# Patient Record
Sex: Female | Born: 1960 | Race: White | Hispanic: No | Smoking: Former smoker
Health system: Southern US, Community
[De-identification: ages and names within clinical notes are randomized; demographics above are authoritative.]

## PROBLEM LIST (undated history)

## (undated) DIAGNOSIS — B009 Herpesviral infection, unspecified: Secondary | ICD-10-CM

## (undated) DIAGNOSIS — B9681 Helicobacter pylori [H. pylori] as the cause of diseases classified elsewhere: Secondary | ICD-10-CM

## (undated) DIAGNOSIS — K7689 Other specified diseases of liver: Secondary | ICD-10-CM

## (undated) DIAGNOSIS — K649 Unspecified hemorrhoids: Secondary | ICD-10-CM

## (undated) DIAGNOSIS — F419 Anxiety disorder, unspecified: Secondary | ICD-10-CM

## (undated) DIAGNOSIS — I319 Disease of pericardium, unspecified: Secondary | ICD-10-CM

## (undated) DIAGNOSIS — K589 Irritable bowel syndrome without diarrhea: Secondary | ICD-10-CM

## (undated) DIAGNOSIS — F319 Bipolar disorder, unspecified: Secondary | ICD-10-CM

## (undated) DIAGNOSIS — F329 Major depressive disorder, single episode, unspecified: Secondary | ICD-10-CM

## (undated) DIAGNOSIS — K5190 Ulcerative colitis, unspecified, without complications: Secondary | ICD-10-CM

## (undated) DIAGNOSIS — F32A Depression, unspecified: Secondary | ICD-10-CM

## (undated) DIAGNOSIS — K5790 Diverticulosis of intestine, part unspecified, without perforation or abscess without bleeding: Secondary | ICD-10-CM

## (undated) DIAGNOSIS — K219 Gastro-esophageal reflux disease without esophagitis: Secondary | ICD-10-CM

## (undated) DIAGNOSIS — K2970 Gastritis, unspecified, without bleeding: Secondary | ICD-10-CM

## (undated) DIAGNOSIS — Z9889 Other specified postprocedural states: Secondary | ICD-10-CM

## (undated) DIAGNOSIS — Z8719 Personal history of other diseases of the digestive system: Secondary | ICD-10-CM

## (undated) HISTORY — PX: ABDOMINAL HYSTERECTOMY: SHX81

## (undated) HISTORY — DX: Herpesviral infection, unspecified: B00.9

## (undated) HISTORY — DX: Unspecified hemorrhoids: K64.9

## (undated) HISTORY — DX: Depression, unspecified: F32.A

## (undated) HISTORY — PX: HERNIA REPAIR: SHX51

## (undated) HISTORY — DX: Gastritis, unspecified, without bleeding: K29.70

## (undated) HISTORY — DX: Personal history of other diseases of the digestive system: Z87.19

## (undated) HISTORY — DX: Ulcerative colitis, unspecified, without complications: K51.90

## (undated) HISTORY — DX: Gastro-esophageal reflux disease without esophagitis: K21.9

## (undated) HISTORY — DX: Major depressive disorder, single episode, unspecified: F32.9

## (undated) HISTORY — DX: Anxiety disorder, unspecified: F41.9

## (undated) HISTORY — PX: ESOPHAGOGASTRODUODENOSCOPY: SHX1529

## (undated) HISTORY — DX: Irritable bowel syndrome, unspecified: K58.9

## (undated) HISTORY — DX: Disease of pericardium, unspecified: I31.9

## (undated) HISTORY — DX: Helicobacter pylori (H. pylori) as the cause of diseases classified elsewhere: B96.81

## (undated) HISTORY — DX: Other specified diseases of liver: K76.89

## (undated) HISTORY — DX: Bipolar disorder, unspecified: F31.9

## (undated) HISTORY — PX: TUBAL LIGATION: SHX77

## (undated) HISTORY — DX: Diverticulosis of intestine, part unspecified, without perforation or abscess without bleeding: K57.90

## (undated) HISTORY — PX: OTHER SURGICAL HISTORY: SHX169

## (undated) HISTORY — DX: Other specified postprocedural states: Z98.890

## (undated) HISTORY — PX: COLONOSCOPY: SHX174

## (undated) HISTORY — PX: DILATION AND CURETTAGE OF UTERUS: SHX78

---

## 1999-03-02 ENCOUNTER — Encounter: Payer: Self-pay | Admitting: Family Medicine

## 1999-03-02 ENCOUNTER — Ambulatory Visit (HOSPITAL_COMMUNITY): Admission: RE | Admit: 1999-03-02 | Discharge: 1999-03-02 | Payer: Self-pay | Admitting: Family Medicine

## 2001-04-09 ENCOUNTER — Other Ambulatory Visit: Admission: RE | Admit: 2001-04-09 | Discharge: 2001-04-09 | Payer: Self-pay | Admitting: Obstetrics and Gynecology

## 2002-04-10 ENCOUNTER — Observation Stay (HOSPITAL_COMMUNITY): Admission: EM | Admit: 2002-04-10 | Discharge: 2002-04-11 | Payer: Self-pay | Admitting: *Deleted

## 2002-04-10 ENCOUNTER — Encounter: Payer: Self-pay | Admitting: *Deleted

## 2003-08-14 ENCOUNTER — Ambulatory Visit (HOSPITAL_COMMUNITY): Admission: RE | Admit: 2003-08-14 | Discharge: 2003-08-14 | Payer: Self-pay | Admitting: Family Medicine

## 2004-01-30 ENCOUNTER — Ambulatory Visit (HOSPITAL_COMMUNITY): Admission: RE | Admit: 2004-01-30 | Discharge: 2004-01-30 | Payer: Self-pay | Admitting: Cardiology

## 2005-03-14 ENCOUNTER — Ambulatory Visit: Payer: Self-pay | Admitting: Internal Medicine

## 2005-03-17 ENCOUNTER — Ambulatory Visit: Payer: Self-pay | Admitting: Internal Medicine

## 2005-03-17 ENCOUNTER — Encounter (INDEPENDENT_AMBULATORY_CARE_PROVIDER_SITE_OTHER): Payer: Self-pay | Admitting: *Deleted

## 2005-04-03 ENCOUNTER — Emergency Department (HOSPITAL_COMMUNITY): Admission: AD | Admit: 2005-04-03 | Discharge: 2005-04-03 | Payer: Self-pay | Admitting: Family Medicine

## 2005-05-10 ENCOUNTER — Ambulatory Visit: Payer: Self-pay | Admitting: Internal Medicine

## 2005-07-12 ENCOUNTER — Ambulatory Visit: Payer: Self-pay | Admitting: Internal Medicine

## 2006-08-29 ENCOUNTER — Ambulatory Visit (HOSPITAL_COMMUNITY): Admission: RE | Admit: 2006-08-29 | Discharge: 2006-08-29 | Payer: Self-pay | Admitting: Family Medicine

## 2006-09-26 DIAGNOSIS — Z8719 Personal history of other diseases of the digestive system: Secondary | ICD-10-CM

## 2006-09-26 HISTORY — DX: Personal history of other diseases of the digestive system: Z87.19

## 2006-12-26 DIAGNOSIS — B9681 Helicobacter pylori [H. pylori] as the cause of diseases classified elsewhere: Secondary | ICD-10-CM

## 2006-12-26 HISTORY — DX: Helicobacter pylori (H. pylori) as the cause of diseases classified elsewhere: B96.81

## 2007-01-17 ENCOUNTER — Ambulatory Visit: Payer: Self-pay | Admitting: Internal Medicine

## 2007-01-17 LAB — CONVERTED CEMR LAB
ALT: 14 units/L (ref 0–40)
AST: 20 units/L (ref 0–37)
Albumin: 3.4 g/dL — ABNORMAL LOW (ref 3.5–5.2)
Alkaline Phosphatase: 55 units/L (ref 39–117)
BUN: 12 mg/dL (ref 6–23)
Basophils Absolute: 0 10*3/uL (ref 0.0–0.1)
Chloride: 107 meq/L (ref 96–112)
Creatinine, Ser: 0.8 mg/dL (ref 0.4–1.2)
HCT: 42.4 % (ref 36.0–46.0)
MCHC: 34.9 g/dL (ref 30.0–36.0)
Monocytes Relative: 11.2 % — ABNORMAL HIGH (ref 3.0–11.0)
RBC: 4.53 M/uL (ref 3.87–5.11)
RDW: 11.2 % — ABNORMAL LOW (ref 11.5–14.6)
Total Bilirubin: 0.6 mg/dL (ref 0.3–1.2)

## 2007-01-22 ENCOUNTER — Ambulatory Visit: Payer: Self-pay | Admitting: Internal Medicine

## 2007-01-22 ENCOUNTER — Encounter: Payer: Self-pay | Admitting: Internal Medicine

## 2007-11-07 ENCOUNTER — Ambulatory Visit: Payer: Self-pay | Admitting: Cardiovascular Disease

## 2008-05-19 ENCOUNTER — Ambulatory Visit: Payer: Self-pay | Admitting: Internal Medicine

## 2008-05-19 DIAGNOSIS — K5150 Left sided colitis without complications: Secondary | ICD-10-CM | POA: Insufficient documentation

## 2008-05-19 DIAGNOSIS — K222 Esophageal obstruction: Secondary | ICD-10-CM | POA: Insufficient documentation

## 2008-05-19 DIAGNOSIS — K5732 Diverticulitis of large intestine without perforation or abscess without bleeding: Secondary | ICD-10-CM | POA: Insufficient documentation

## 2009-01-09 ENCOUNTER — Encounter: Admission: RE | Admit: 2009-01-09 | Discharge: 2009-01-09 | Payer: Self-pay | Admitting: Family Medicine

## 2009-07-29 ENCOUNTER — Telehealth: Payer: Self-pay | Admitting: Internal Medicine

## 2009-09-03 ENCOUNTER — Ambulatory Visit: Payer: Self-pay | Admitting: Internal Medicine

## 2009-12-29 ENCOUNTER — Ambulatory Visit: Payer: Self-pay | Admitting: Cardiology

## 2009-12-29 DIAGNOSIS — R42 Dizziness and giddiness: Secondary | ICD-10-CM | POA: Insufficient documentation

## 2009-12-29 DIAGNOSIS — R0789 Other chest pain: Secondary | ICD-10-CM | POA: Insufficient documentation

## 2009-12-29 DIAGNOSIS — R0602 Shortness of breath: Secondary | ICD-10-CM | POA: Insufficient documentation

## 2009-12-29 DIAGNOSIS — R079 Chest pain, unspecified: Secondary | ICD-10-CM | POA: Insufficient documentation

## 2010-10-26 NOTE — Assessment & Plan Note (Signed)
Summary: palpitations, sob, chest fullness/cy.   Visit Type:  new pt visit Primary Provider:  Herb Grays  CC:  palpitations...sob when she had palpatations...pain on left arm/shoulder..chest fullness feeling....  History of Present Illness: Valerie Reed returns today for reevaluation of recurrent chest pain.  I saw her originally in 2005 which turned out to be pericarditis. Responded to nonsteroidals as well as colcichine.  2 nights ago, when she laid down around 11:00, she developed some irregularity and palpitations. She then had a dull ache in the mid sternum. It did not radiate. She had no nausea vomiting diaphoresis.  She was able to go to sleep but was bothered by low bit yesterday. It is not exertion related.  She denies any fever chills sweats hemoptysis or pleuritic chest pain. She has had no lower shiny edema and no signs of clots or swelling.  She exercises on a regular basis and has not noted any decrease in her functional capacity.  She is already feeling better later today.  Preventive Screening-Counseling & Management  Alcohol-Tobacco     Smoking Status: never  Caffeine-Diet-Exercise     Does Patient Exercise: yes      Drug Use:  no.    Current Medications (verified): 1)  Premarin 0.3 Mg Tabs (Estrogens Conjugated) .Marland Kitchen.. 1 Tab Once Daily 2)  Depakote Er 500 Mg Xr24h-Tab (Divalproex Sodium) .Marland Kitchen.. 1 Tab Once Daily 3)  Lamictal 100 Mg Tabs (Lamotrigine) .Marland Kitchen.. 1 Tab Once Daily 4)  Asacol 400 Mg Tbec (Mesalamine) .... 3 Tabs Two Times A Day  Allergies (verified): No Known Drug Allergies  Past History:  Past Medical History: Ulcerative colitis  Hx of Pericarditis Hx Anxiety and Depression  Past Surgical History: Hysterectomy  Social History: Full Time Divorced  Tobacco Use - No.  Alcohol Use - yes Regular Exercise - yes Drug Use - no Smoking Status:  never Does Patient Exercise:  yes Drug Use:  no  Review of Systems       negative other than history  of present illness  Vital Signs:  Patient profile:   50 year old female Height:      66 inches Weight:      139 pounds BMI:     22.52 Pulse rate:   68 / minute Pulse rhythm:   regular BP sitting:   92 / 64  (left arm) Cuff size:   large  Vitals Entered By: Danielle Rankin, CMA (December 29, 2009 4:47 PM)  Physical Exam  General:  Well developed, well nourished, in no acute distress. Head:  normocephalic and atraumatic Eyes:  PERRLA/EOM intact; conjunctiva and lids normal. Neck:  Neck supple, no JVD. No masses, thyromegaly or abnormal cervical nodes. Chest Valerie Reed:  no deformities or breast masses noted Lungs:  Clear bilaterally to auscultation and percussion. Heart:  Non-displaced PMI, chest non-tender; regular rate and rhythm, S1, S2 without murmurs, rubs or gallops. Carotid upstroke normal, no bruit. Normal abdominal aortic size, no bruits. Femorals normal pulses, no bruits. Pedals normal pulses. No edema, no varicosities. Abdomen:  Bowel sounds positive; abdomen soft and non-tender without masses, organomegaly, or hernias noted. No hepatosplenomegaly. Msk:  Back normal, normal gait. Muscle strength and tone normal. Pulses:  pulses normal in all 4 extremities Extremities:  No clubbing or cyanosis. Neurologic:  Alert and oriented x 3. Skin:  Intact without lesions or rashes. Psych:  Normal affect.   Problems:  Medical Problems Added: 1)  Dx of Chest Pain-unspecified  (ICD-786.50)  EKG  Procedure date:  12/29/2009  Findings:      normal sinus rhythm, normal EKG  Impression & Recommendations:  Problem # 1:  CHEST PAIN-UNSPECIFIED (ICD-786.50) Assessment New This is clearly not her cardiac pain. Is not supported by history, exam, or EKG. Reassurance is been given. No further cardiac workup at this time. Orders: EKG w/ Interpretation (93000)  Patient Instructions: 1)  Your physician recommends that you schedule a follow-up appointment in: AS NEEDED 2)  Your physician  recommends that you continue on your current medications as directed. Please refer to the Current Medication list given to you today.

## 2011-02-03 ENCOUNTER — Other Ambulatory Visit: Payer: Self-pay | Admitting: Family Medicine

## 2011-02-03 DIAGNOSIS — N63 Unspecified lump in unspecified breast: Secondary | ICD-10-CM

## 2011-02-08 NOTE — Assessment & Plan Note (Signed)
[facility]                            CARDIOLOGY OFFICE NOTE   NAME:HOVEY, GATHA MCNULTY                     MRN:          [id number]  DATE:11/07/2007                            DOB:          [birth year]    Mrs. Lamona Curl is a delightful 50 year old patient referred by Dr. Farris Has  and Dr. Collins Scotland for chest pain and dizziness and dyspnea.   The patient apparently has a history of pericarditis seen by Dr. Daleen Squibb in  the past.   She describes being on anti-inflammatories and colchicine.   The patient has not been feeling well for a few months.  She complains  of somewhat atypical chest pain.  It is not necessarily exertional.  It  is fairly sharp pain in left side of her chest that radiates up towards  her neck and shoulder.  It is worse on her left side.  She also  describes a pressure type sensation.  She is fairly active.  She does  yoga and exercises on a regular basis and this does not necessarily make  the pain worse.  Along with this, she describes dyspnea and fatigue.  These seem to be more chronic symptoms over the last few months.  Dr.  Lamona Curl checked her normal blood work including a sed rate and TSH and T4  and they were normal.   The patient describes dizziness.  It is nonspecific.  It is non  postural.  It is not related to neck movements or classic Vernier  maneuvers.   The the patient does have a history of anxiety and depression.   Her review of systems otherwise negative.  In particular the pain is not  particularly pleuritic.  There is been no history of DVT or PE.  She has  no history of a systemic connective tissue disease and has not had a  recurrence of her pericarditis recently.   PAST MEDICAL HISTORY:  Remarkable for ulcerative colitis.  Asacol  usually takes care of this.  There is question of mild asthma, anxiety,  depression, previous hysterectomy.   The patient is separated.  She and Dr. Lamona Curl seem to get along well.  She older  children.  She sells equipment to physicians.  She does not  smoke or drink.   FAMILY HISTORY:  Is remarkable for a brother who had an MI at age 24.  Mother is alive.  Father died at age 68 of cancer.   The patient's current medication include Depakote 500 as a call 400 mg 3  tablets b.i.d., Premarin 0.325 a day and omeprazole 20 a day.  She has  had some p.r.n., Naprosyn, but she does not like to take a lot of  antiinflammatories due to her ulcerative colitis.   EXAM:  Is remarkable for healthy-appearing white female who looks  younger than her stated age.  Affect is appropriate.  Weight is 137, blood pressure is 112/76, pulse 64 and regular,  respiratory rate 14.  HEENT:  Unremarkable.  Carotids are without bruit, no lymphadenopathy, thyromegaly, JVP  elevation.  LUNGS:  Clear diaphragmatic motion.  No wheezing.  S1-S2  without rub.  No murmur.  PMI normal.  Abdomen is benign.  Bowel sounds positive AAA No hepatosplenomegaly or  hepatojugular reflux, distal pulse intact, no edema.  NEURO:  Nonfocal.  SKIN:  Warm and dry.  No muscular weakness.  No real reproducibility of  pain to anterior posterior compression or abduction and adduction  maneuvers.   Her baseline EKG is normal.  As indicated Dr. Lamona Curl did some routine  blood work including a CBC BMET sed rate and TSH which were normal.  I  suggested to him that he possibly follow-up with a double-stranded DNA,  rheumatoid factor, ANA.  He did bring a disk in with him today with a  chest x-ray on the patient.  I reviewed this.  Heart size is normal.  Mediastinum is normal.  Lungs are clear with no abnormalities in her  chest film.   IMPRESSION:  1. Atypical chest pain, history of pericarditis.  Follow-up stress      echo.  2. Previous pericarditis without rubs sed rate normal.  Doubt      recurrence.  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  3. History of anxiety, depression.  Follow-up with Dr. Collins Scotland.  4. Ulcerative colitis continue Asacol.  Follow-up with Dr. Leone Payor,      one of her GI doctor she has seen in the past.  Continue omeprazole      20 a day.  5. Status post hysterectomy.  Continue Premarin 0.32 mg a day at      discretion of OB/GYN doctor.   So long as her stress echo was normal.  I do not think she will need  further cardiac workup     Peter C. Eden Emms, MD, [facility]  Electronically Signed    PCN/MedQ  DD: 11/07/2007  DT: 11/08/2007  Job #: [id number]   cc:   Tammy R. Collins Scotland, M.D.

## 2011-02-09 ENCOUNTER — Ambulatory Visit
Admission: RE | Admit: 2011-02-09 | Discharge: 2011-02-09 | Disposition: A | Discharge status: Home / Self Care | Payer: 59 | Source: Ambulatory Visit | Attending: Family Medicine | Admitting: Family Medicine

## 2011-02-09 DIAGNOSIS — N63 Unspecified lump in unspecified breast: Secondary | ICD-10-CM

## 2011-02-11 NOTE — H&P (Signed)
[place]  Patient:    Valerie Reed, Valerie Reed Visit Number: [id number] MRN: [id number]          Service Type: OBV Location: [id number] Attending Physician:  Fenton Malling Dictated by:   Kelli Hope, M.D. Admit Date:  04/10/2002 Discharge Date: 04/11/2002   CC:         Consuello Bossier., M.D.   History and Physical  DATE OF BIRTH:  1961/04/04  CHIEF COMPLAINT:  Postoperative confusion.  HISTORY OF PRESENT ILLNESS:  This is the initial [facility] admission for this 50 year old woman with little past medical history, who underwent an abdominoplasty yesterday at [facility].  She was groggy postop but not unusually difficult to arouse and had no focal problems.  This morning, she was noted to be more confused, dysarthric and disoriented.  She did not improve with observation or with administration of naloxone.  She was brought to [facility] Emergency Room for further evaluation.  Since that time, she has gotten a little bit better but she remains confused and distractible.  Review of her records indicates that she received an intramuscular injection of Demerol 75 mg and Phenergan 25 mg at about 1300 yesterday and subsequently received eight tablets of Mepergan Fortis over about a 16-hour period.  The patient presently denies any headache.  She is aware that she is not making sense but is only somewhat aware of just how confused she is, but she denies any focal symptoms including headache, weakness or numbness in the limbs, dysarthria or dysphagia.  PAST MEDICAL HISTORY:  She has been treated for depression in the past.  She also has mild asthma for which she rarely uses inhalers.  She has had a hysterectomy in the past.  SOCIAL HISTORY:  She lives with her husband is normally independent in her activities of daily living.  She works for a [employer].  FAMILY HISTORY:  Noncontributory.  ALLERGIES:  No  known drug allergies.  MEDICATIONS:  Premarin, Depakote, albuterol and Atrovent inhalers.  REVIEW OF SYSTEMS:  As above.  She denies any headache, chest pain, shortness of breath, nausea, vomiting, diarrhea.  She was able to eat yesterday.  She has had no urinary symptoms.  She has had some neck pain in the past but nothing recently.  She has had no fever or rash and vital signs were stable at [facility] yesterday.  PHYSICAL EXAMINATION:  VITAL SIGNS:  Temperature 98.2, blood pressure 133/82, pulse 65, respirations 18, O2 saturation 98% on room air.  GENERAL:  She is alert and in no evident distress.  Her speech is fluent and not dysarthric.  HEENT:  Head:  Cranium is normocephalic and atraumatic.  Oropharynx is benign.  NECK:  Supple without carotid bruit.  CHEST:  Clear to auscultation.  HEART:  Regular rate and rhythm without murmurs.  ABDOMEN:  Soft and somewhat tender near drain site.  Bowel sounds are normoactive.  She has drains in place.  The wounds do not appear actively infected.  EXTREMITIES:  No edema.  Pulses 2+.  NEUROLOGIC:  Mental status:  She is oriented to time but not to place, identifying at [facility] and later [facility].  She is able to register and recall three memory objects.  She can repeat a phrase but has a little bit of trouble with confrontational naming, but she seems distractible and often will briefly say something about a subject unrelated to the conversation.  Cranial nerves:  Funduscopic exam is benign.  Pupils are equal and briskly reactive.  Extraocular movements are normal without nystagmus.  Visual fields are full to confrontation.  Face, tongue and palate move normally and symmetrically.  Motor:  Normal bulk and tone.  Normal strength in all tested extremity muscles.  Sensation is grossly intact. Reflexes are symmetric.  Toes are downgoing.  Finger-to-nose is performed adequately.  She ambulates with one-person  moderate assist and is a little bit unsteady with doing that.  LABORATORY AND ACCESSORY DATA:  CBC and BMET are unremarkable.  CT of the head is personally reviewed and is normal.  IMPRESSION:  Postoperative encephalopathy.  This is almost certainly due to medications.  PLAN:  We will observe overnight in the hospital.  If she remains confused in the morning, we will consider repeat CT of the head and also check an EEG. Dictated by:   Kelli Hope, M.D. Attending Physician:  Fenton Malling DD:  04/10/02 TD:  04/13/02 Job: [id number]

## 2013-03-21 ENCOUNTER — Encounter: Payer: Self-pay | Admitting: Internal Medicine

## 2013-05-14 ENCOUNTER — Encounter: Payer: Self-pay | Admitting: Internal Medicine

## 2013-06-13 ENCOUNTER — Ambulatory Visit: Payer: 59 | Admitting: Internal Medicine

## 2013-12-29 ENCOUNTER — Emergency Department (HOSPITAL_COMMUNITY): Payer: Self-pay

## 2013-12-29 ENCOUNTER — Encounter (HOSPITAL_COMMUNITY): Payer: Self-pay | Admitting: Emergency Medicine

## 2013-12-29 DIAGNOSIS — F411 Generalized anxiety disorder: Secondary | ICD-10-CM | POA: Insufficient documentation

## 2013-12-29 DIAGNOSIS — F319 Bipolar disorder, unspecified: Secondary | ICD-10-CM | POA: Insufficient documentation

## 2013-12-29 DIAGNOSIS — K5190 Ulcerative colitis, unspecified, without complications: Secondary | ICD-10-CM | POA: Insufficient documentation

## 2013-12-29 DIAGNOSIS — K5730 Diverticulosis of large intestine without perforation or abscess without bleeding: Secondary | ICD-10-CM | POA: Insufficient documentation

## 2013-12-29 DIAGNOSIS — K589 Irritable bowel syndrome without diarrhea: Secondary | ICD-10-CM | POA: Insufficient documentation

## 2013-12-29 DIAGNOSIS — R51 Headache: Principal | ICD-10-CM | POA: Insufficient documentation

## 2013-12-29 DIAGNOSIS — Z87891 Personal history of nicotine dependence: Secondary | ICD-10-CM | POA: Insufficient documentation

## 2013-12-29 DIAGNOSIS — H539 Unspecified visual disturbance: Secondary | ICD-10-CM | POA: Insufficient documentation

## 2013-12-29 NOTE — ED Notes (Signed)
Nurse First rounds ; Nurse explained delay / wait time and process to pt. Respirations unlabored / no distress.

## 2013-12-29 NOTE — ED Notes (Signed)
The pt has had a lt sided headache since 1445 today.  Initially she had vision problems that started prior to the headache or head pressure.  .  She feels like her vision is tunnell vision worse on the lt.   No facial droop no arm drift.  No speech difficulties

## 2013-12-30 ENCOUNTER — Encounter (HOSPITAL_COMMUNITY): Payer: Self-pay | Admitting: Internal Medicine

## 2013-12-30 ENCOUNTER — Observation Stay (HOSPITAL_COMMUNITY): Payer: Self-pay

## 2013-12-30 ENCOUNTER — Observation Stay (HOSPITAL_COMMUNITY)
Admission: EM | Admit: 2013-12-30 | Discharge: 2013-12-30 | Disposition: A | Discharge status: Home / Self Care | Payer: Self-pay | Attending: Internal Medicine | Admitting: Internal Medicine

## 2013-12-30 DIAGNOSIS — R51 Headache: Secondary | ICD-10-CM

## 2013-12-30 DIAGNOSIS — R519 Headache, unspecified: Secondary | ICD-10-CM | POA: Diagnosis present

## 2013-12-30 DIAGNOSIS — H5340 Unspecified visual field defects: Secondary | ICD-10-CM

## 2013-12-30 DIAGNOSIS — K5150 Left sided colitis without complications: Secondary | ICD-10-CM

## 2013-12-30 LAB — CBC WITH DIFFERENTIAL/PLATELET
Basophils Absolute: 0 10*3/uL (ref 0.0–0.1)
Basophils Relative: 0 % (ref 0–1)
Eosinophils Absolute: 0.2 10*3/uL (ref 0.0–0.7)
Eosinophils Relative: 4 % (ref 0–5)
HCT: 40.9 % (ref 36.0–46.0)
Hemoglobin: 14.4 g/dL (ref 12.0–15.0)
Lymphocytes Relative: 43 % (ref 12–46)
Lymphs Abs: 3 10*3/uL (ref 0.7–4.0)
MCH: 32.7 pg (ref 26.0–34.0)
MCHC: 35.2 g/dL (ref 30.0–36.0)
MCV: 92.7 fL (ref 78.0–100.0)
Monocytes Absolute: 0.7 10*3/uL (ref 0.1–1.0)
Monocytes Relative: 11 % (ref 3–12)
Neutro Abs: 2.9 10*3/uL (ref 1.7–7.7)
Neutrophils Relative %: 42 % — ABNORMAL LOW (ref 43–77)
Platelets: 163 10*3/uL (ref 150–400)
RBC: 4.41 MIL/uL (ref 3.87–5.11)
RDW: 12 % (ref 11.5–15.5)
WBC: 6.9 10*3/uL (ref 4.0–10.5)

## 2013-12-30 LAB — I-STAT CHEM 8, ED
BUN: 11 mg/dL (ref 6–23)
Calcium, Ion: 1.17 mmol/L (ref 1.12–1.23)
Chloride: 106 mEq/L (ref 96–112)
Creatinine, Ser: 0.8 mg/dL (ref 0.50–1.10)
Glucose, Bld: 91 mg/dL (ref 70–99)
HCT: 40 % (ref 36.0–46.0)
Hemoglobin: 13.6 g/dL (ref 12.0–15.0)
Potassium: 3.7 mEq/L (ref 3.7–5.3)
Sodium: 144 mEq/L (ref 137–147)
TCO2: 26 mmol/L (ref 0–100)

## 2013-12-30 MED ORDER — MESALAMINE 1000 MG RE SUPP
1000.0000 mg | Freq: Every day | RECTAL | Status: DC
  Filled 2013-12-30: qty 1

## 2013-12-30 MED ORDER — ENOXAPARIN SODIUM 40 MG/0.4ML ~~LOC~~ SOLN
40.0000 mg | Freq: Every day | SUBCUTANEOUS | Status: DC
  Filled 2013-12-30: qty 0.4

## 2013-12-30 MED ORDER — LORAZEPAM 2 MG/ML IJ SOLN
1.0000 mg | Freq: Once | INTRAMUSCULAR | Status: AC
  Administered 2013-12-30: 1 mg via INTRAVENOUS
  Filled 2013-12-30: qty 1

## 2013-12-30 MED ORDER — ACETAMINOPHEN 650 MG RE SUPP: 650.0000 mg | Freq: Four times a day (QID) | RECTAL | Status: DC | PRN

## 2013-12-30 MED ORDER — ONDANSETRON HCL 4 MG/2ML IJ SOLN: 4.0000 mg | Freq: Four times a day (QID) | INTRAMUSCULAR | Status: DC | PRN

## 2013-12-30 MED ORDER — ACETAMINOPHEN 325 MG PO TABS: 650.0000 mg | ORAL_TABLET | Freq: Four times a day (QID) | ORAL | Status: DC | PRN

## 2013-12-30 MED ORDER — ONDANSETRON HCL 4 MG PO TABS: 4.0000 mg | ORAL_TABLET | Freq: Four times a day (QID) | ORAL | Status: DC | PRN

## 2013-12-30 NOTE — Progress Notes (Signed)
This NP called by Dr. Waymon AmatoHongalgi, attending, at shift change. Pt has been symptom free today. Pt's MRI pending and if NEG, can go home tonight. Discharge paper work, f/up, etc has already been done by [employer] Warner[employer]ongalgi.  This NP checked MRI results at 1955 and is neg for findings explaining her visual disturbances. RN Huntley DecSara called and told pt may be discharged home and be sure to f/up as an outpt as ordered. Discharge order placed.  Jimmye NormanKaren Kirby-Graham, NP [facility]

## 2013-12-30 NOTE — H&P (Signed)
Patient's PCP: Delorse Lek, MD  Chief Complaint: Vision problems on the left  History of Present Illness: Valerie Reed is a 53 y.o. Caucasian female with history of diverticulosis, ulcerative colitis, gastritis, irritable bowel syndrome, depression, and anxiety who presents with the above complaints.  Patient reports that her symptoms started at 245 p.m. when she noted initially that she had strange vision with blurring on the left field from both eyes.  She would have the symptoms last for about 10 minutes before it improved.  She had recurrent episodes every 5-15 minutes.  She then noticed that her symptoms were localized only in the left eye affecting the left side of the vision.  As her symptoms are not improving she presented to the emergency department for further evaluation.  She had the symptoms to midnight, after which she had improvement in her symptoms.  She did complain of having a pressure-like sensation over her frontal head.  She denies any symptoms like this in the past, but did note that she had occasional strange sensation which lasts for a few seconds.  Of note, she did indicate that she had had cold-like symptoms one and a half weeks ago.  She denies any recent fevers, chills, nausea, vomiting, chest pain, shortness of breath, abdominal pain, or diarrhea.  Patient was evaluated by neurology in the emergency department, who recommended an MRI of the brain.  Hospitalist service was asked to admit the patient for further care and management.  Of note, patient reports that she took a sleep aid the previous night, she has taken this medication in the past without any of the above episodes.  Review of Systems: All systems reviewed with the patient and positive as per history of present illness, otherwise all other systems are negative.  Past Medical History  Diagnosis Date  . Diverticulosis   . UC (ulcerative colitis)     Left sided   . Gastritis   . S/P dilatation of esophageal  stricture 2008  . Bipolar disorder   . Anxiety disorder   . Depression   . Pericarditis   . Helicobacter pylori gastritis 12/2006  . IBS (irritable bowel syndrome)    Past Surgical History  Procedure Laterality Date  . Colonoscopy    . Esophagogastroduodenoscopy    . Tummy tuck    . Hernia repair    . Abdominal hysterectomy     Family History  Problem Relation Age of Onset  . Colon cancer Neg Hx   . Diabetes Mother   . Heart disease Mother   . Irritable bowel syndrome Mother    History   Social History  . Marital Status: Divorced    Spouse Name: N/A    Number of Children: N/A  . Years of Education: N/A   Occupational History  . Not on file.   Social History Main Topics  . Smoking status: Former Smoker    Quit date: 12/30/2005  . Smokeless tobacco: Not on file  . Alcohol Use: No  . Drug Use: No  . Sexual Activity: Not on file   Other Topics Concern  . Not on file   Social History Narrative  . No narrative on file   Allergies: Review of patient's allergies indicates no known allergies.  Home Meds: Prior to Admission medications   Medication Sig Start Date End Date Taking? Authorizing Provider  mesalamine (CANASA) 1000 MG suppository Place 1,000 mg rectally at bedtime.   Yes Historical Provider, MD    Physical Exam: Blood pressure  118/59, pulse 69, temperature 98.3 F (36.8 C), temperature source Oral, resp. rate 18, height 5\' 6"  (1.676 m), weight 59.875 kg (132 lb), SpO2 99.00%. General: Awake, Oriented x3, No acute distress. HEENT: EOMI, Moist mucous membranes Neck: Supple CV: S1 and S2 Lungs: Clear to ascultation bilaterally Abdomen: Soft, Nontender, Nondistended, +bowel sounds. Ext: Good pulses. Trace edema. No clubbing or cyanosis noted. Neuro: Cranial Nerves II-XII grossly intact. Has 5/5 motor strength in upper and lower extremities.  Lab results:  Recent Labs  12/30/13 0234  NA 144  K 3.7  CL 106  GLUCOSE 91  BUN 11  CREATININE 0.80    No results found for this basename: AST, ALT, ALKPHOS, BILITOT, PROT, ALBUMIN,  in the last 72 hours No results found for this basename: LIPASE, AMYLASE,  in the last 72 hours  Recent Labs  12/30/13 0227 12/30/13 0234  WBC 6.9  --   NEUTROABS 2.9  --   HGB 14.4 13.6  HCT 40.9 40.0  MCV 92.7  --   PLT 163  --    No results found for this basename: CKTOTAL, CKMB, CKMBINDEX, TROPONINI,  in the last 72 hours No components found with this basename: POCBNP,  No results found for this basename: DDIMER,  in the last 72 hours No results found for this basename: HGBA1C,  in the last 72 hours No results found for this basename: CHOL, HDL, LDLCALC, TRIG, CHOLHDL, LDLDIRECT,  in the last 72 hours No results found for this basename: TSH, T4TOTAL, FREET3, T3FREE, THYROIDAB,  in the last 72 hours No results found for this basename: VITAMINB12, FOLATE, FERRITIN, TIBC, IRON, RETICCTPCT,  in the last 72 hours Imaging results:  Ct Head Wo Contrast  12/29/2013   CLINICAL DATA:  Left-sided headache with vision alteration  EXAM: CT HEAD WITHOUT CONTRAST  TECHNIQUE: Contiguous axial images were obtained from the base of the skull through the vertex without intravenous contrast. Study was obtained within 24 hr of patient's arrival at the emergency department.  COMPARISON:  Report of prior head CT April 10, 2002 available ; images from that study not available.  FINDINGS: The ventricles are normal in size and configuration. The right lateral ventricle is slightly larger than the left lateral ventricle, an anatomic variant. There is no mass, hemorrhage, extra-axial fluid collection, or midline shift. Gray-white compartments are normal. There is no demonstrable acute infarct. Bony calvarium appears intact. The mastoid air cells are clear.  IMPRESSION: Study within normal limits.   Electronically Signed   By: Bretta BangWilliam  Woodruff M.D.   On: 12/29/2013 20:53   Assessment & Plan by Problem: Left field of vision changes  with +/- eye Unclear etiology. Query if this is related to migraine.  Appreciate input from neurology.  MRI of the brain is pending, if MRI shows findings concerning for CVA will need further workup including pacing the patient on telemetry.  Discussed with the patient about starting the patient on aspirin, she indicates that she has an intolerance to aspirin and prefers waiting until the MRI is done to determine if she needs to be on aspirin.  Headache Management as indicated above.  History of ulcerative colitis Continue home medication.  Prophylaxis Lovenox.  CODE STATUS Full code.  Disposition Admit the patient to a medical bed as observation for MRI of the brain.  Time spent on admission, talking to the patient, and coordinating care was: 45 mins.  Armond Cuthrell A, MD 12/30/2013, 3:47 AM

## 2013-12-30 NOTE — Progress Notes (Signed)
Pt was given orders for discharge and told she was "all set to go" by the on-call physician. When I gave the patient her discharge paperwork and relayed the message she was unhappy that no physician was going to see her before she left. Pt stated "I've been here for over 24 hours and have no answers for why this happened to me." I asked her if she would like to stay and speak with someone but she told me no that she wanted to go home and rest. I removed her IV and escorted patient to the lobby. Mirinda Monte, [place] ScrapeSarah E

## 2013-12-30 NOTE — ED Notes (Signed)
Admitting MD at BS.  

## 2013-12-30 NOTE — Discharge Summary (Signed)
Physician Discharge Summary  Valerie Reed ZOX:[id number] DOB: 09/02/1961 DOA: 12/30/2013  PCP: Delorse Lek, MD  Admit date: 12/30/2013 Discharge date: 12/30/2013  Time spent: Less than 30 minutes  Recommendations for Outpatient Follow-up:  1. Dr. Marjory Lies, PCP in 3 days.  Discharge Diagnoses:  Principal Problem:   Visual field loss Active Problems:   ULCERATIVE COLITIS, LEFT SIDED   Headache   Discharge Condition: Improved & Stable  Diet recommendation: Heart healthy diet  Filed Weights   12/29/13 1947 12/30/13 0410  Weight: 59.875 kg (132 lb) 58.514 kg (129 lb)    History of present illness & hospital course:  53 year old female with history of migraines when younger, diverticulosis, ulcerative colitis, gastritis, IBS, depression and anxiety, presented on 12/30/13 with history of vision changes over the course of the day. She stated that initially it felt like her vision went "out" and then like her "eyes were crossed". This sensation had been waxing and waning over the course of the day and she noticed that her symptoms were more prominent on the left than the right. She also complained of mild headache and nausea. Once she arrived to the ED, her symptoms significantly improved but still had some symptoms. She denies prior such episodes. CT head was negative. Neurology evaluated the patient and suspected that this represented complicated migraine, however without clear migraine (just mild headache) and with persisting symptoms, need to rule out posterior circulation ischemia. This morning, patient's symptoms had resolved. She has been waiting for MRI brain which was delayed until this evening and she is currently in MRI. Patient apparently told her nurse that if she's not discharged today, she plans to sign out AMA. Advised nursing that we will have her discharge paperwork ready pending MRI report. When the MRI report is available, this can be reviewed with the night physician  extender and if negative, she can be discharged home with close outpatient followup with her PCP. She was counseled that this M.D. earlier today that if she has recurrence of symptoms, she will have to seek immediate medical attention and may need an ophthalmology consultation. She verbalized understanding.   Consultations:  Neurology  Procedures:  None    Discharge Exam:  Complaints:  Denied headache or visual problems this morning.  Filed Vitals:   12/30/13 0230 12/30/13 0300 12/30/13 0410 12/30/13 0938  BP: 133/65 118/59 132/68 119/60  Pulse: 55 69 64 79  Temp:   97.7 F (36.5 C)   TempSrc:   Oral   Resp:    20  Height:   5\' 6"  (1.676 m)   Weight:   58.514 kg (129 lb)   SpO2: 98% 99% 100% 100%    General exam: Pleasant middle-aged female sitting comfortably in bed. Respiratory system: Clear. No increased work of breathing. Cardiovascular system: S1 & S2 heard, RRR. No JVD, murmurs, gallops, clicks or pedal edema. Gastrointestinal system: Abdomen is nondistended, soft and nontender. Normal bowel sounds heard. Central nervous system: Alert and oriented. No focal neurological deficits. Extremities: Symmetric 5 x 5 power.  Discharge Instructions      Discharge Orders   Future Orders Complete By Expires   Activity as tolerated - No restrictions  As directed    Call MD for:  severe uncontrolled pain  As directed    Call MD for:  As directed    Comments:     Recurrence or worsening visual disturbance.   Diet - low sodium heart healthy  As directed  Medication List         mesalamine 1000 MG suppository  Commonly known as:  CANASA  Place 1,000 mg rectally at bedtime.          The results of significant diagnostics from this hospitalization (including imaging, microbiology, ancillary and laboratory) are listed below for reference.    Significant Diagnostic Studies: Ct Head Wo Contrast  12/29/2013   CLINICAL DATA:  Left-sided headache with vision  alteration  EXAM: CT HEAD WITHOUT CONTRAST  TECHNIQUE: Contiguous axial images were obtained from the base of the skull through the vertex without intravenous contrast. Study was obtained within 24 hr of patient's arrival at the emergency department.  COMPARISON:  Report of prior head CT April 10, 2002 available ; images from that study not available.  FINDINGS: The ventricles are normal in size and configuration. The right lateral ventricle is slightly larger than the left lateral ventricle, an anatomic variant. There is no mass, hemorrhage, extra-axial fluid collection, or midline shift. Gray-white compartments are normal. There is no demonstrable acute infarct. Bony calvarium appears intact. The mastoid air cells are clear.  IMPRESSION: Study within normal limits.   Electronically Signed   By: Bretta BangWilliam  Woodruff M.D.   On: 12/29/2013 20:53    Microbiology: No results found for this or any previous visit (from the past 240 hour(s)).   Labs: Basic Metabolic Panel:  Recent Labs Lab 12/30/13 0234  NA 144  K 3.7  CL 106  GLUCOSE 91  BUN 11  CREATININE 0.80   Liver Function Tests: No results found for this basename: AST, ALT, ALKPHOS, BILITOT, PROT, ALBUMIN,  in the last 168 hours No results found for this basename: LIPASE, AMYLASE,  in the last 168 hours No results found for this basename: AMMONIA,  in the last 168 hours CBC:  Recent Labs Lab 12/30/13 0227 12/30/13 0234  WBC 6.9  --   NEUTROABS 2.9  --   HGB 14.4 13.6  HCT 40.9 40.0  MCV 92.7  --   PLT 163  --    Cardiac Enzymes: No results found for this basename: CKTOTAL, CKMB, CKMBINDEX, TROPONINI,  in the last 168 hours BNP: BNP (last 3 results) No results found for this basename: PROBNP,  in the last 8760 hours CBG: No results found for this basename: GLUCAP,  in the last 168 hours   Signed:  Marcellus ScottHONGALGI,Tommey Barret, MD, FACP, FHM. [facility] Pager [phone]  If 7PM-7AM, please contact  night-coverage [email or web address] Password [email or web address] 12/30/2013, 6:44 PM

## 2013-12-30 NOTE — Consult Note (Signed)
Neurology Consultation Reason for Consult: vision changes Referring Physician: Alberteen Sam, B attending)  CC: vision changes  History is obtained from:patient  HPI: Valerie Reed is a 53 y.o. female with a history of migraines when younger who presents with vision changes over the course of the day. She states taht initially, it fel tlike her vision went "out, then like her "eyes were crossed." this sensation has been waxing and waning over the course of the day, and she notices that her symptoms are much more prominent on the left than right.   Currently, she still has some symptoms, but it has markedly improved. She has a mld discomfort in teh left retro-orbital region. She has also noticed some nausea today.   She has a history of hysterectomy but took HRT until last year.    LKW: 3pm tpa given?: no, out of window.     ROS: A 14 point ROS was performed and is negative except as noted in the HPI.  Past Medical History  Diagnosis Date  . Diverticulosis   . UC (ulcerative colitis)     Left sided   . Gastritis   . S/P dilatation of esophageal stricture 2008  . Bipolar disorder   . Anxiety disorder   . Depression   . Pericarditis   . Helicobacter pylori gastritis 12/2006  . IBS (irritable bowel syndrome)     Family History: Mother dm, heart disease  Social History: Tob: denies  Exam: Current vital signs: BP 118/59  Pulse 69  Temp(Src) 98.3 F (36.8 C) (Oral)  Resp 18  Ht 5\' 6"  (1.676 m)  Wt 59.875 kg (132 lb)  BMI 21.32 kg/m2  SpO2 99% Vital signs in last 24 hours: Temp:  [98.3 F (36.8 C)-98.4 F (36.9 C)] 98.3 F (36.8 C) (04/05 2213) Pulse Rate:  [55-75] 69 (04/06 0300) Resp:  [14-20] 18 (04/06 0112) BP: (118-146)/(59-82) 118/59 mmHg (04/06 0300) SpO2:  [98 %-100 %] 99 % (04/06 0300) Weight:  [59.875 kg (132 lb)] 59.875 kg (132 lb) (04/05 1947)  General: in bed, NAD CV: RRR Mental Status: Patient is awake, alert, oriented to person, place,  month, year, and situation. Immediate and remote memory are intact. Patient is able to give a clear and coherent history. No signs of aphasia or neglect Cranial Nerves: II: Visual Fields are full. Pupils are equal, round, and reactive to light.  Discs are difficult to visualize. III,IV, VI: EOMI without ptosis or diploplia.  V: Facial sensation is symmetric to temperature VII: Facial movement is symmetric.  VIII: hearing is intact to voice X: Uvula elevates symmetrically XI: Shoulder shrug is symmetric. XII: tongue is midline without atrophy or fasciculations.  Motor: Tone is normal. Bulk is normal. 5/5 strength was present in all four extremities.  Sensory: Sensation is symmetric to light touch and temperature in the arms and legs. Deep Tendon Reflexes: 2+ and symmetric in the biceps and patellae.  Plantars: Toes are downgoing bilaterally.  Cerebellar: FNF and HKS are intact bilaterally Gait: Patient has a stable casual gait. Able to tandem, heel, and toe walk.         I have reviewed labs in epic and the results pertinent to this consultation are: Cbc, chem 8 unremarkable  I have reviewed the images obtained:CT head - negative  Impression: 53 yo F with waxing and waning vision change over the course of the day. I suspect that this represents complicated migraine, however without clear migraine(just mild headache) and with persistetn symptoms, she will  need an MRI to rule out posterior circulation ischemia.   Recommendations: 1) MRI brain w/o contrast, if negative then no further workup is needed.  2) If positive for stroke, will need stroke workup.    Ritta SlotMcNeill Jaylen Claude, MD [facility] [phone]  If 7pm- 7am, please page neurology on call as listed in AMION.

## 2013-12-30 NOTE — Progress Notes (Signed)
UR completed 

## 2013-12-30 NOTE — Progress Notes (Signed)
Received report from Becky, RN.  

## 2013-12-30 NOTE — ED Notes (Signed)
NP at bedside.

## 2013-12-30 NOTE — Progress Notes (Signed)
Called Dr. Betti Cruzeddy to clarify if pt needs telemetry. Dr. Betti Cruzeddy said pt does not need telemetry. Will continue to monitor pt.

## 2013-12-30 NOTE — Progress Notes (Signed)
Received pt from ED via stretcher. Pt alert, oriented and not in acute distress. Oriented pt to the room. Will continue to monitor.

## 2013-12-30 NOTE — ED Provider Notes (Signed)
CSN: [id number]     Arrival date & time 12/29/13  1940 History   First MD Initiated Contact with Patient 12/30/13 0111     Chief Complaint  Patient presents with  . Headache     (Consider location/radiation/quality/duration/timing/severity/associated sxs/prior Treatment) HPI Comments: Patient reports, she had a cute onset of left visual field loss of driving.  This afternoon, at 3:00.  This has been persistent, since that time.  She reports a mild discomfort with this, but no true headache, no nausea, dizziness. Doesn't have a previous history of headaches, visual disturbances, high blood pressure, seasonal allergy  Patient is a 53 y.o. female presenting with headaches. The history is provided by the patient.  Headache Pain location:  L temporal Quality:  Dull Radiates to:  Does not radiate Severity currently:  1/10 Duration:  10 hours Timing:  Constant Progression:  Improving Chronicity:  New Similar to prior headaches: no   Context: not exposure to bright light, not stress and not straining   Relieved by:  None tried Worsened by:  Nothing tried Associated symptoms: blurred vision   Associated symptoms: no dizziness, no pain, no nausea, no neck pain and no photophobia   Associated symptoms comment:  Visual field loss on the left   Past Medical History  Diagnosis Date  . Diverticulosis   . UC (ulcerative colitis)     Left sided   . Gastritis   . S/P dilatation of esophageal stricture 2008  . Bipolar disorder   . Anxiety disorder   . Depression   . Pericarditis   . Helicobacter pylori gastritis 12/2006  . IBS (irritable bowel syndrome)    Past Surgical History  Procedure Laterality Date  . Colonoscopy    . Esophagogastroduodenoscopy    . Tummy tuck    . Hernia repair    . Abdominal hysterectomy     Family History  Problem Relation Age of Onset  . Colon cancer Neg Hx   . Diabetes Mother   . Heart disease Mother   . Irritable bowel syndrome Mother    History   Substance Use Topics  . Smoking status: Former Games developermoker  . Smokeless tobacco: Not on file  . Alcohol Use: No   OB History   Grav Para Term Preterm Abortions TAB SAB Ect Mult Living                 Review of Systems  Constitutional: Negative for activity change and appetite change.  Eyes: Positive for blurred vision and visual disturbance. Negative for photophobia, pain and itching.  Respiratory: Negative for shortness of breath.   Cardiovascular: Negative for leg swelling.  Gastrointestinal: Negative for nausea.  Musculoskeletal: Negative for neck pain.  Neurological: Positive for headaches. Negative for dizziness.      Allergies  Review of patient's allergies indicates no known allergies.  Home Medications   Current Outpatient Rx  Name  Route  Sig  Dispense  Refill  . mesalamine (CANASA) 1000 MG suppository   Rectal   Place 1,000 mg rectally at bedtime.          BP 133/65  Pulse 55  Temp(Src) 98.3 F (36.8 C) (Oral)  Resp 18  Ht 5\' 6"  (1.676 m)  Wt 132 lb (59.875 kg)  BMI 21.32 kg/m2  SpO2 98% Physical Exam  Nursing note and vitals reviewed. Constitutional: She is oriented to person, place, and time. She appears well-developed and well-nourished.  HENT:  Head: Normocephalic.  Right Ear: External ear normal.  Left Ear: External ear normal.  Mouth/Throat: Oropharynx is clear and moist.  Eyes: Pupils are equal, round, and reactive to light. Left eye exhibits no chemosis, no exudate and no hordeolum. No foreign body present in the left eye. Left conjunctiva is not injected. Left conjunctiva has no hemorrhage.  Fundoscopic exam:      The left eye shows no arteriolar narrowing, no AV nicking, no hemorrhage and no papilledema. The left eye shows no venous pulsations.  Neck: Normal range of motion.  Cardiovascular: Normal rate and regular rhythm.   Pulmonary/Chest: Effort normal.  Abdominal: Soft.  Musculoskeletal: She exhibits no edema.  Neurological: She is  alert and oriented to person, place, and time. She displays normal reflexes. No cranial nerve deficit. She exhibits normal muscle tone. Coordination normal.  Skin: Skin is warm. No rash noted.    ED Course  Procedures (including critical care time) Labs Review Labs Reviewed  CBC WITH DIFFERENTIAL - Abnormal; Notable for the following:    Neutrophils Relative % 42 (*)    All other components within normal limits  I-STAT CHEM 8, ED   Imaging Review Ct Head Wo Contrast  12/29/2013   CLINICAL DATA:  Left-sided headache with vision alteration  EXAM: CT HEAD WITHOUT CONTRAST  TECHNIQUE: Contiguous axial images were obtained from the base of the skull through the vertex without intravenous contrast. Study was obtained within 24 hr of patient's arrival at the emergency department.  COMPARISON:  Report of prior head CT April 10, 2002 available ; images from that study not available.  FINDINGS: The ventricles are normal in size and configuration. The right lateral ventricle is slightly larger than the left lateral ventricle, an anatomic variant. There is no mass, hemorrhage, extra-axial fluid collection, or midline shift. Gray-white compartments are normal. There is no demonstrable acute infarct. Bony calvarium appears intact. The mastoid air cells are clear.  IMPRESSION: Study within normal limits.   Electronically Signed   By: Bretta Bang M.D.   On: 12/29/2013 20:53     EKG Interpretation None      MDM  Head CT Scan normal  Dr. Amada Jupiter- neuro to see patient  Neuro feels more likely complicated migraine verses CVA will admitt and obtain MRI in AM Final diagnoses:  Visual field cut        Arman Filter, NP 12/30/13 0308  Arman Filter, NP 12/30/13 1610

## 2013-12-31 NOTE — ED Provider Notes (Signed)
Medical screening examination/treatment/procedure(s) were performed by non-physician practitioner and as supervising physician I was immediately available for consultation/collaboration.   Liliya Fullenwider, MD 12/31/13 0819 

## 2014-01-15 ENCOUNTER — Ambulatory Visit: Payer: Self-pay | Admitting: Obstetrics and Gynecology

## 2014-11-28 ENCOUNTER — Other Ambulatory Visit: Payer: Self-pay | Admitting: Gastroenterology

## 2014-11-28 DIAGNOSIS — R1310 Dysphagia, unspecified: Secondary | ICD-10-CM

## 2014-12-04 ENCOUNTER — Other Ambulatory Visit: Payer: Self-pay

## 2014-12-05 ENCOUNTER — Other Ambulatory Visit: Payer: Self-pay

## 2016-04-25 ENCOUNTER — Other Ambulatory Visit: Payer: Self-pay | Admitting: Dermatology

## 2016-04-25 ENCOUNTER — Ambulatory Visit
Admission: RE | Admit: 2016-04-25 | Discharge: 2016-04-25 | Disposition: A | Discharge status: Home / Self Care | Payer: BLUE CROSS/BLUE SHIELD | Source: Ambulatory Visit | Attending: Dermatology | Admitting: Dermatology

## 2016-04-25 DIAGNOSIS — L298 Other pruritus: Secondary | ICD-10-CM

## 2016-05-11 ENCOUNTER — Ambulatory Visit (INDEPENDENT_AMBULATORY_CARE_PROVIDER_SITE_OTHER): Payer: BLUE CROSS/BLUE SHIELD | Admitting: Obstetrics and Gynecology

## 2016-05-11 ENCOUNTER — Encounter: Payer: Self-pay | Admitting: Obstetrics and Gynecology

## 2016-05-11 VITALS — BP 110/60 | HR 60 | Resp 14 | Ht 66.0 in | Wt 135.0 lb

## 2016-05-11 DIAGNOSIS — N951 Menopausal and female climacteric states: Secondary | ICD-10-CM | POA: Diagnosis not present

## 2016-05-11 DIAGNOSIS — Z872 Personal history of diseases of the skin and subcutaneous tissue: Secondary | ICD-10-CM

## 2016-05-11 NOTE — Progress Notes (Signed)
55 y.o. [id number] DivorcedCaucasianF here c/o right breast dimpling.  She initially noticed it in the last year, more pronounced recently. Normal mammogram in 1/17. No pain, no lumps.  She has vasomotor symptoms, helped on natural medication. Off the medication symptoms are worse during the night. She was on ERT for years, doesn't want to be on ERT any more.  She has ulcerative colitis and ulcerative proctitis.     No LMP recorded. Patient has had a hysterectomy.          Sexually active: Yes.    The current method of family planning is status post hysterectomy.    Exercising: Yes.    strength training/walking/ running and yoga Smoker:  Former smoker   Health Maintenance: Pap:  1995 WNL per patient  History of abnormal Pap:  no MMG:  09/2015 WNL per patient Colonoscopy:  05/2014 BMD:   unsure TDaP:  Up to date  Gardasil: N/A   reports that she quit smoking about 10 years ago. She has never used smokeless tobacco. She reports that she does not drink alcohol or use drugs.  Past Medical History:  Diagnosis Date  . Anxiety   . Anxiety disorder   . Bipolar disorder (HCC)   . Depression   . Diverticulosis   . Gastritis   . Helicobacter pylori gastritis 12/2006  . IBS (irritable bowel syndrome)   . Pericarditis   . S/P dilatation of esophageal stricture 2008  . UC (ulcerative colitis) (HCC)    Left sided     Past Surgical History:  Procedure Laterality Date  . ABDOMINAL HYSTERECTOMY    . COLONOSCOPY    . DILATION AND CURETTAGE OF UTERUS    . ESOPHAGOGASTRODUODENOSCOPY    . HERNIA REPAIR    . TUBAL LIGATION    . Tummy Tuck    Hysterectomy for endometriosis, BSO. She was on ERT x 20 years, now on HRT from a natural HRT. With estrogen and progesterone. Had blood work with the integrative medicine.   Current Outpatient Prescriptions  Medication Sig Dispense Refill  . FINACEA 15 % cream     . mesalamine (CANASA) 1000 MG suppository Place 1,000 mg rectally at bedtime.    . NON  FORMULARY Phytob- L-4 X 2 drops sublingual  QD     No current facility-administered medications for this visit.     Family History  Problem Relation Age of Onset  . Diabetes Mother   . Heart disease Mother   . Irritable bowel syndrome Mother   . Alzheimer's disease Mother   . Melanoma Father   . Breast cancer Paternal Aunt     Review of Systems  Constitutional: Negative.   HENT: Negative.   Eyes: Negative.   Respiratory: Negative.   Cardiovascular: Negative.   Gastrointestinal: Negative.   Endocrine: Negative.   Genitourinary:       Right breast dimpling   Musculoskeletal: Negative.   Skin: Negative.   Allergic/Immunologic: Negative.   Neurological: Negative.   Psychiatric/Behavioral: Negative.     Exam:   BP 110/60 (BP Location: Right Arm, Patient Position: Sitting, Cuff Size: Normal)   Pulse 60   Resp 14   Ht 5\' 6"  (1.676 m)   Wt 135 lb (61.2 kg)   BMI 21.79 kg/m   Weight change: @WEIGHTCHANGE @ Height:   Height: 5\' 6"  (167.6 cm)  Ht Readings from Last 3 Encounters:  05/11/16 5\' 6"  (1.676 m)  12/30/13 5\' 6"  (1.676 m)  12/29/09 5\' 6"  (1.676 m)  General appearance: alert, cooperative and appears stated age Breasts: no lumps or skin changes, both breasts have dimpling with raising her arms on the right side it is at 7 and 9 o'clock. In the left breast it is at 7 o'clock Lymph nodes:  supraclavicular, and axillary nodes normal.    A:  Bilateral breast dimpling, suspect benign   H/O Hysterectomy, BSO  H/O severe vasomotor symptoms, currently on "natural medication", not a prescription??  Discussed the risks of HRT, discussed bio-identicals, I told her she should assume the risk is  the same as for HRT. [facility] information given and reviewed    P:   Diagnostic breast imaging  Monthly breast self exams  She will send a copy of her medication in for evaluation. We discussed ERT, she desires  Bioidentical. I told her I didn't think she needed progesterone  She  will return for an annual exam in 6 weeks with a breast check  Over 30 minutes spent face to face, over 50 % in counseling

## 2016-05-13 ENCOUNTER — Telehealth: Payer: Self-pay

## 2016-05-13 DIAGNOSIS — Z872 Personal history of diseases of the skin and subcutaneous tissue: Secondary | ICD-10-CM

## 2016-05-13 NOTE — Telephone Encounter (Signed)
Call to patient to discuss dates and times that are best for the patient to be seen for bilateral diagnostic imaging with ultrasounds. Patient was seen in the office on 05/11/2016 with Dr.Jertson and this was recommended due to bilateral breast dimpling with raising her arms on the right side it is at 7 and 9 o'clock. In the left breast it is at 7 o'clock. The patient was unable to stay to schedule while in the office. Patient requests a Monday morning. Tuesday any time. Thursday any time, but not between 10 am- 12 pm. Any time on Friday. Advised I will contact the [facility] and return call with appointment date and time. She is agreeable.

## 2016-05-13 NOTE — Telephone Encounter (Signed)
Patient returned call

## 2016-05-13 NOTE — Telephone Encounter (Signed)
Spoke with patient. Advised of [facility] date [facility]. The patient is agreeable to date and time.  Routing to provider for final review. Patient agreeable to disposition. Will close encounter.

## 2016-05-13 NOTE — Telephone Encounter (Signed)
Left message to call Eastyn Skalla at [phone]. 

## 2016-05-13 NOTE — Telephone Encounter (Signed)
[facility] Appointment for bilateral diagnostic mammogram with ultrasounds scheduled for 05/17/2016 at 8 am. Patient placed in mammogram hold.  Left message to call Dre Gamino at [phone].

## 2016-05-13 NOTE — Telephone Encounter (Signed)
Patient returning call.

## 2016-05-17 ENCOUNTER — Other Ambulatory Visit: Payer: Self-pay | Admitting: Obstetrics and Gynecology

## 2016-05-17 ENCOUNTER — Ambulatory Visit
Admission: RE | Admit: 2016-05-17 | Discharge: 2016-05-17 | Disposition: A | Discharge status: Home / Self Care | Payer: BLUE CROSS/BLUE SHIELD | Source: Ambulatory Visit | Attending: Obstetrics and Gynecology | Admitting: Obstetrics and Gynecology

## 2016-05-17 DIAGNOSIS — Z872 Personal history of diseases of the skin and subcutaneous tissue: Secondary | ICD-10-CM

## 2016-06-23 ENCOUNTER — Ambulatory Visit (INDEPENDENT_AMBULATORY_CARE_PROVIDER_SITE_OTHER): Payer: BLUE CROSS/BLUE SHIELD | Admitting: Obstetrics and Gynecology

## 2016-06-23 ENCOUNTER — Encounter: Payer: Self-pay | Admitting: Obstetrics and Gynecology

## 2016-06-23 VITALS — BP 100/60 | HR 60 | Resp 13 | Ht 65.5 in | Wt 133.0 lb

## 2016-06-23 DIAGNOSIS — Z8739 Personal history of other diseases of the musculoskeletal system and connective tissue: Secondary | ICD-10-CM | POA: Insufficient documentation

## 2016-06-23 DIAGNOSIS — Z780 Asymptomatic menopausal state: Secondary | ICD-10-CM

## 2016-06-23 DIAGNOSIS — N951 Menopausal and female climacteric states: Secondary | ICD-10-CM

## 2016-06-23 DIAGNOSIS — Z0000 Encounter for general adult medical examination without abnormal findings: Secondary | ICD-10-CM

## 2016-06-23 DIAGNOSIS — N952 Postmenopausal atrophic vaginitis: Secondary | ICD-10-CM | POA: Diagnosis not present

## 2016-06-23 DIAGNOSIS — Z01419 Encounter for gynecological examination (general) (routine) without abnormal findings: Secondary | ICD-10-CM | POA: Diagnosis not present

## 2016-06-23 LAB — LIPID PANEL
CHOL/HDL RATIO: 2.8 ratio (ref <=5.0)
Cholesterol: 180 mg/dL (ref 125–200)
HDL: 65 mg/dL (ref >=46)
LDL CALC: 99 mg/dL (ref <130)
Triglycerides: 78 mg/dL (ref <150)
VLDL: 16 mg/dL (ref <30)

## 2016-06-23 MED ORDER — ESTRADIOL 0.0375 MG/24HR TD PTTW: 1.0000 | MEDICATED_PATCH | TRANSDERMAL | 1 refills | Status: DC

## 2016-06-23 NOTE — Patient Instructions (Signed)

## 2016-06-23 NOTE — Progress Notes (Addendum)
55 y.o. [id number] DivorcedCaucasianF here for annual exam.  The patient was seen last month with c/o breast dimpling in the left breast. Negative imaging. F/U imaging in the left breast was recommended in 3 months.  The patient is taking a medication she gets on line that has estriol and progesterone. She is wondering about bio-identical's. She c/o increasing hot flashes recently, she can have several a day or not have any. She wakes up at night hot, some sweats. The sleep issues are her biggest issue.  She does have vaginal dryness. Sexually active, discussed using a lubricant.  She has ulcerative proctitis, is getting better.  She thinks she gets a lot of yeast infection, self medicates.     No LMP recorded. Patient has had a hysterectomy.          Sexually active: Yes.    The current method of family planning is status post hysterectomy.    Exercising: Yes.    cross fit, yoga, walking Smoker:  Former smoker  Health Maintenance: Pap:  1995 WNL per patient  History of abnormal Pap:  no MMG:  05-17-16 Abnormal - recommended Left DX mammogram in November Colonoscopy:  2015 Ulcerative Proctitis  BMD:   unsure TDaP:  unsure Gardasil: N/A   reports that she quit smoking about 10 years ago. She has never used smokeless tobacco. She reports that she does not drink alcohol or use drugs. She is in Manufacturing systems engineer. Kids are 37, 36 and 34. 7 grand kids, live close by. One son is getting married in 1/18  Past Medical History:  Diagnosis Date  . Anxiety   . Anxiety disorder   . Bipolar disorder (HCC)   . Depression   . Diverticulosis   . Gastritis   . Helicobacter pylori gastritis 12/2006  . IBS (irritable bowel syndrome)   . Pericarditis   . S/P dilatation of esophageal stricture 2008  . UC (ulcerative colitis) (HCC)    Left sided     Past Surgical History:  Procedure Laterality Date  . ABDOMINAL HYSTERECTOMY    . COLONOSCOPY    . DILATION AND CURETTAGE OF UTERUS    .  ESOPHAGOGASTRODUODENOSCOPY    . HERNIA REPAIR    . TUBAL LIGATION    . Tummy Tuck      Current Outpatient Prescriptions  Medication Sig Dispense Refill  . Cholecalciferol (VITAMIN D3) 5000 units CAPS Take by mouth.    . mesalamine (CANASA) 1000 MG suppository Place 1,000 mg rectally at bedtime.    . NON FORMULARY Phytob- L-4 X 2 drops sublingual  QD     No current facility-administered medications for this visit.     Family History  Problem Relation Age of Onset  . Diabetes Mother   . Heart disease Mother   . Irritable bowel syndrome Mother   . Alzheimer's disease Mother   . Melanoma Father   . Breast cancer Paternal Aunt     Review of Systems  Constitutional: Negative.   HENT: Negative.   Eyes: Negative.   Respiratory: Negative.   Cardiovascular: Negative.   Gastrointestinal: Negative.   Endocrine: Negative.   Genitourinary: Negative.   Musculoskeletal: Negative.   Skin: Negative.   Allergic/Immunologic: Negative.   Neurological: Negative.   Psychiatric/Behavioral: Negative.     Exam:   BP 100/60 (BP Location: Right Arm, Patient Position: Sitting, Cuff Size: Normal)   Pulse 60   Resp 13   Ht 5' 5.5" (1.664 m)   Wt 133 lb (60.3 kg)  BMI 21.80 kg/m   Weight change: @WEIGHTCHANGE @ Height:   Height: 5' 5.5" (166.4 cm)  Ht Readings from Last 3 Encounters:  06/23/16 5' 5.5" (1.664 m)  05/11/16 5\' 6"  (1.676 m)  12/30/13 5\' 6"  (1.676 m)    General appearance: alert, cooperative and appears stated age Head: Normocephalic, without obvious abnormality, atraumatic Neck: no adenopathy, supple, symmetrical, trachea midline and thyroid normal to inspection and palpation Lungs: clear to auscultation bilaterally Breasts: normal appearance, no masses or tenderness Heart: regular rate and rhythm Abdomen: soft, non-tender; bowel sounds normal; no masses,  no organomegaly Extremities: extremities normal, atraumatic, no cyanosis or edema Skin: Skin color, texture, turgor  normal. No rashes or lesions Lymph nodes: Cervical, supraclavicular, and axillary nodes normal. No abnormal inguinal nodes palpated Neurologic: Grossly normal   Pelvic: External genitalia:  no lesions              Urethra:  normal appearing urethra with no masses, tenderness or lesions              Bartholins and Skenes: normal                 Vagina: atrophic vagina. Some thick white d/c noted (just treated with monistat)              Cervix: absent               Bimanual Exam:  Uterus:  absent              Adnexa: no mass, fullness, tenderness               Rectovaginal: Confirms               Anus:  normal sphincter tone, no lesions  Chaperone was present for exam.  A:  Well Woman with normal exam  H/O TAH/BSO at 8135   H/O osteopenia  Vasomotor symptoms  Vaginal dryness    P:   Vit D fasting lipid panel, she will send a copy of her labs from her dermatologist to make sure she doesn't need any more labs  She is taking 5,000 IU a day over the last few months  No pap needed  Will start Vivelle dot, 0,0375 mg, f/u in 1 month, aware of risks, [facility] information given at her last visit  F/U imaging of her left breast in 11/17  Discussed calcium 1,200 mg     Addendum: labs from 04/25/16 reviewed. Normal TFT's, normal CBC, normal metabolic panel.  Will inform the patient to return for a fasting lipid panel and vit d.

## 2016-06-24 ENCOUNTER — Telehealth: Payer: Self-pay | Admitting: Obstetrics and Gynecology

## 2016-06-24 LAB — VITAMIN D 25 HYDROXY (VIT D DEFICIENCY, FRACTURES): VIT D 25 HYDROXY: 45 ng/mL (ref 30–100)

## 2016-06-24 NOTE — Telephone Encounter (Signed)
Sleepiness is not a common side effect of the estrogen, but this may be her reaction to it.  Ok to cut the patch in half and place 1/2 patch twice weekly.  The patient may continue on the half dosing if this helps to control her symptoms and does not result in sleepiness.   Cc- Dr. Oscar LaJertson

## 2016-06-24 NOTE — Telephone Encounter (Signed)
Spoke with patient. Advised of message as seen below from Dr.Silva. Patient is agreeable and verbalizes understanding.  Cc: Dr.Jertson  Routing to covering provider for final review. Patient agreeable to disposition. Will close encounter.

## 2016-06-24 NOTE — Telephone Encounter (Signed)
Left message to call [name] at [phone]. 

## 2016-06-24 NOTE — Telephone Encounter (Signed)
Patient returning call.

## 2016-06-24 NOTE — Telephone Encounter (Signed)
Patient is calling to see if she can cut her pill in half because it is making her sleepy. She states she spoke with the pharmacy and they said that should be fine but to call and check with her doctor first.

## 2016-06-24 NOTE — Telephone Encounter (Signed)
Spoke with patient. Patient states that she was started on Vivelle Dot yesterday at her OV. Patient placed her first patch last night and states she has been increasingly fatigued. Patient spoke with the pharmacist regarding cutting the patch in half for the first few doses and then increasing to a whole patch for her "body to adjust." Asking for Dr.Jertson's recommendations. Advised I will speak with covering provider as Dr.Jertson is out of the office today and return call with further recommendations. Patient is agreeable.  Routing to Dr.Silva for review and advise. Patient is on Vivelle Dot 0.0375 mg

## 2016-07-03 ENCOUNTER — Telehealth: Payer: Self-pay | Admitting: Obstetrics and Gynecology

## 2016-07-03 NOTE — Telephone Encounter (Signed)
Please let the patient know that I got a copy of her labs from the end of July. Given that lab work and the lab work we already did, she doesn't need any more blood work at this time.

## 2016-07-04 ENCOUNTER — Ambulatory Visit (INDEPENDENT_AMBULATORY_CARE_PROVIDER_SITE_OTHER): Payer: BLUE CROSS/BLUE SHIELD | Admitting: Obstetrics and Gynecology

## 2016-07-04 ENCOUNTER — Encounter: Payer: Self-pay | Admitting: Obstetrics and Gynecology

## 2016-07-04 VITALS — BP 118/70 | HR 64 | Resp 14 | Wt 136.0 lb

## 2016-07-04 DIAGNOSIS — N3090 Cystitis, unspecified without hematuria: Secondary | ICD-10-CM | POA: Diagnosis not present

## 2016-07-04 DIAGNOSIS — R350 Frequency of micturition: Secondary | ICD-10-CM | POA: Diagnosis not present

## 2016-07-04 DIAGNOSIS — R3915 Urgency of urination: Secondary | ICD-10-CM

## 2016-07-04 DIAGNOSIS — R300 Dysuria: Secondary | ICD-10-CM | POA: Diagnosis not present

## 2016-07-04 LAB — POCT URINALYSIS DIPSTICK
BILIRUBIN UA: NEGATIVE
GLUCOSE UA: NEGATIVE
Ketones, UA: NEGATIVE
Nitrite, UA: NEGATIVE
Protein, UA: NEGATIVE
Urobilinogen, UA: NEGATIVE
pH, UA: 6.5

## 2016-07-04 MED ORDER — PHENAZOPYRIDINE HCL 200 MG PO TABS: ORAL_TABLET | ORAL | 0 refills | Status: DC

## 2016-07-04 MED ORDER — SULFAMETHOXAZOLE-TRIMETHOPRIM 800-160 MG PO TABS: 1.0000 | ORAL_TABLET | Freq: Two times a day (BID) | ORAL | 0 refills | Status: DC

## 2016-07-04 NOTE — Telephone Encounter (Signed)
Spoke with patient and informed her of Dr. Salli QuarryJertson's message. Patient voiced understanding.  Patient is c/o "bladder burning"  since starting the estradiol.  She denies itching, discharge or odor. She thinks her urine needs to be checked. Patient willing to come in today. Made patient an appointment for today at 4pm -eh

## 2016-07-04 NOTE — Progress Notes (Signed)
GYNECOLOGY  VISIT   HPI: 55 y.o.   Divorced  Caucasian  female   [phone] with No LMP recorded. Patient has had a hysterectomy.   here c/o dysuria, urinary frequency and urgency x 1 week. She has a constant burning sensation in her bladder. Mild dysuria, the internal burning is worse. No fevers, some flank discomfort.  She just started on ERT, taking half of the 0.375 mg patch. Feeling better, vasomotor symptoms are better, sleeping better. She is up 3 lbs since her last visit a few weeks ago, worried it's from the patch. She did just go away to a wedding over the weekend.   GYNECOLOGIC HISTORY: No LMP recorded. Patient has had a hysterectomy. Contraception:hyterectomy Menopausal hormone therapy: estradiol         OB History    Gravida Para Term Preterm AB Living   3 3 3     3    SAB TAB Ectopic Multiple Live Births           3         Patient Active Problem List   Diagnosis Date Noted  . History of osteopenia 06/23/2016  . Menopausal syndrome 06/23/2016  . Visual field loss 12/30/2013  . Headache 12/30/2013  . DIZZINESS 12/29/2009  . DYSPNEA 12/29/2009  . CHEST PAIN-UNSPECIFIED 12/29/2009  . 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 ULCERATIVE COLITIS, LEFT SIDED 05/19/2008  . DIVERTICULITIS, COLON 05/19/2008    Past Medical History:  Diagnosis Date  . Anxiety   . Anxiety disorder   . Bipolar disorder (HCC)   . Depression   . Diverticulosis   . Gastritis   . Helicobacter pylori gastritis 12/2006  . IBS (irritable bowel syndrome)   . Pericarditis   . S/P dilatation of esophageal stricture 2008  . UC (ulcerative colitis) (HCC)    Left sided     Past Surgical History:  Procedure Laterality Date  . ABDOMINAL HYSTERECTOMY    . COLONOSCOPY    . DILATION AND CURETTAGE OF UTERUS    . ESOPHAGOGASTRODUODENOSCOPY    . HERNIA REPAIR    . TUBAL LIGATION    . Tummy Tuck      Current Outpatient Prescriptions  Medication Sig Dispense Refill  . Cholecalciferol (VITAMIN D3) 5000 units  CAPS Take by mouth.    . estradiol (VIVELLE-DOT) 0.0375 MG/24HR Place 1 patch onto the skin 2 (two) times a week. 8 patch 1  . mesalamine (CANASA) 1000 MG suppository Place 1,000 mg rectally at bedtime.     No current facility-administered medications for this visit.      ALLERGIES: Review of patient's allergies indicates no known allergies.  Family History  Problem Relation Age of Onset  . Diabetes Mother   . Heart disease Mother   . Irritable bowel syndrome Mother   . Alzheimer's disease Mother   . Melanoma Father   . Breast cancer Paternal Aunt     Social History   Social History  . Marital status: Divorced    Spouse name: N/A  . Number of children: N/A  . Years of education: N/A   Occupational History  . Not on file.   Social History Main Topics  . Smoking status: Former Smoker    Quit date: 12/30/2005  . Smokeless tobacco: Never Used  . Alcohol use No  . Drug use: No  . Sexual activity: Yes    Partners: Male    Birth control/ protection: Surgical   Other Topics Concern  . Not on file  Social History Narrative  . No narrative on file    Review of Systems  Constitutional: Negative.   HENT: Negative.   Eyes: Negative.   Respiratory: Negative.   Cardiovascular: Negative.   Gastrointestinal: Positive for constipation.  Genitourinary: Positive for dysuria, frequency and urgency.  Musculoskeletal: Negative.   Skin: Negative.   Neurological: Negative.   Endo/Heme/Allergies: Negative.   Psychiatric/Behavioral: Negative.     PHYSICAL EXAMINATION:    BP 118/70 (BP Location: Right Arm, Patient Position: Sitting, Cuff Size: Normal)   Pulse 64   Resp 14   Wt 136 lb (61.7 kg)   BMI 22.29 kg/m     General appearance: alert, cooperative and appears stated age Abdomen: soft, mildly tender BLQ/SP region, no rebound, no guarding, not distended, no masses,  no organomegaly CVA: not tender   Urine dip: 2+ leukocytes, 3+ blood  ASSESSMENT Cystits ERT,  recently started, only using 1/2 of a 0.375 mg patch. Menopausal symptoms have improved. Doesn't need a progesterone (s/p hysterectomy)    PLAN Send urine for ua, c&s Treat with Bactrim DS and Pyridium I reassured the patient that the incredibly small amount of estrogen she is on is not likely causing weight gain    An After Visit Summary was printed and given to the patient.

## 2016-07-04 NOTE — Patient Instructions (Signed)

## 2016-07-05 LAB — URINALYSIS, MICROSCOPIC ONLY
Bacteria, UA: NONE SEEN [HPF]
Casts: NONE SEEN [LPF]
Crystals: NONE SEEN [HPF]
RBC / HPF: NONE SEEN RBC/HPF (ref <=2)
Squamous Epithelial / HPF: NONE SEEN [HPF] (ref <=5)
Yeast: NONE SEEN [HPF]

## 2016-07-06 LAB — URINE CULTURE

## 2016-07-18 ENCOUNTER — Telehealth: Payer: Self-pay | Admitting: Obstetrics and Gynecology

## 2016-07-18 NOTE — Telephone Encounter (Signed)
Patient canceled her 4 week med check appointment 07/21/16. Patient to call later to reschedule.

## 2016-07-21 ENCOUNTER — Ambulatory Visit: Payer: BLUE CROSS/BLUE SHIELD | Admitting: Obstetrics and Gynecology

## 2016-08-15 NOTE — Telephone Encounter (Signed)
Will close encounter

## 2016-08-15 NOTE — Telephone Encounter (Signed)
Okay to close encounter? (Left a message for patient to call and reschedule)

## 2016-08-17 ENCOUNTER — Other Ambulatory Visit: Payer: BLUE CROSS/BLUE SHIELD

## 2016-08-25 ENCOUNTER — Other Ambulatory Visit: Payer: BLUE CROSS/BLUE SHIELD

## 2016-09-01 ENCOUNTER — Other Ambulatory Visit: Payer: BLUE CROSS/BLUE SHIELD

## 2016-11-16 ENCOUNTER — Telehealth: Payer: Self-pay

## 2016-11-16 NOTE — Telephone Encounter (Addendum)
Patient was seen for bilateral diagnostic mammogram and ultrasound on 05/17/2016. Follow up L breast diagnostic mammogram and ultrasound was recommended for 3 month follow up. Patient was scheduled for 08/17/2016. [facility] and declines to reschedule.  Left message for patient to call Chanette Demo at [phone] to see if she would like assistance in scheduling appointment.

## 2016-11-22 NOTE — Telephone Encounter (Signed)
Spoke with patient. Patient declines to schedule recommended follow up for left breast diagnostic mammogram and ultrasound. States this area has been present for a while and has not changed. Does not wish to follow up at this time. States she will follow up in August 2018 when she is due for her screening mammogram. Aware the [facility] will still recommend this imaging be done at that time and usually must be completed along with yearly. Patient is agreeable.  Dr.Jertson please advise next steps? Move hold to August 2018?

## 2016-11-22 NOTE — Telephone Encounter (Signed)
The patient is aware of the potential risks, but her mammogram recall in for 8/18. She is on HRT, I would not recommend refilling her HRT until the recommended imaging is done.

## 2016-11-23 NOTE — Telephone Encounter (Signed)
Recall placed for 04/2017.  Routing to provider for final review. Patient agreeable to disposition. Will close encounter.

## 2017-03-25 IMAGING — MG 2D DIGITAL DIAGNOSTIC BILATERAL MAMMOGRAM WITH CAD AND ADJUNCT T
8 of 12 series · 8 of 28 positions shown · non-contrast
Comparison: 02/09/2011 and 08/29/2006

CLINICAL DATA: Dimpling of both breasts. Patient has noticed
dimpling in the lower inner quadrant of the left breast. On physical
exam additional dimpling has been noted in the right breast,
reported to be in the 7 and 9 o'clock location.

EXAM:
2D DIGITAL DIAGNOSTIC BILATERAL MAMMOGRAM WITH ADJUNCT TOMO
ULTRASOUND BILATERAL BREAST

[R MLO]
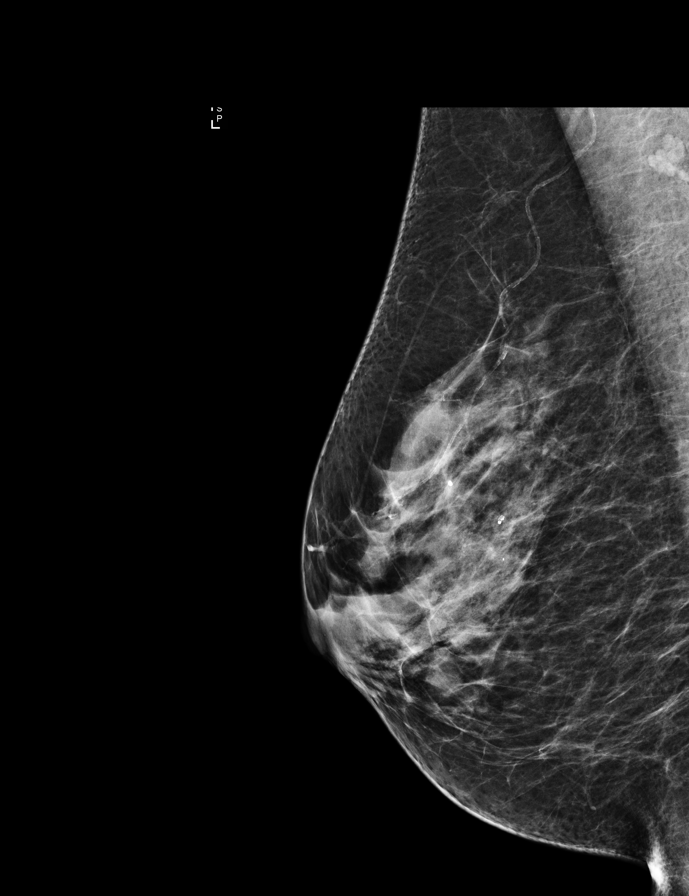

[R CC synth-2D]
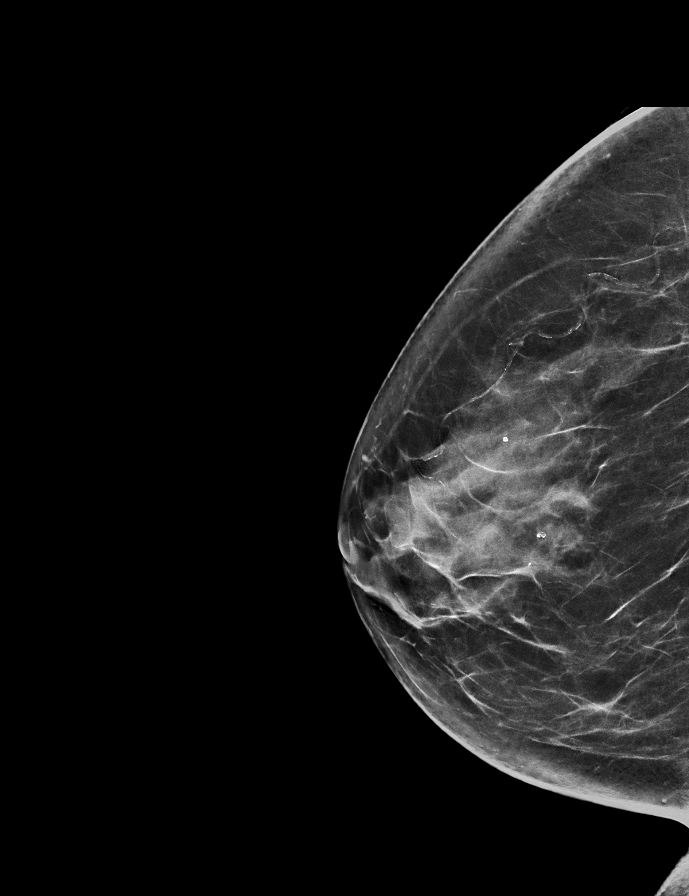

[L CC]
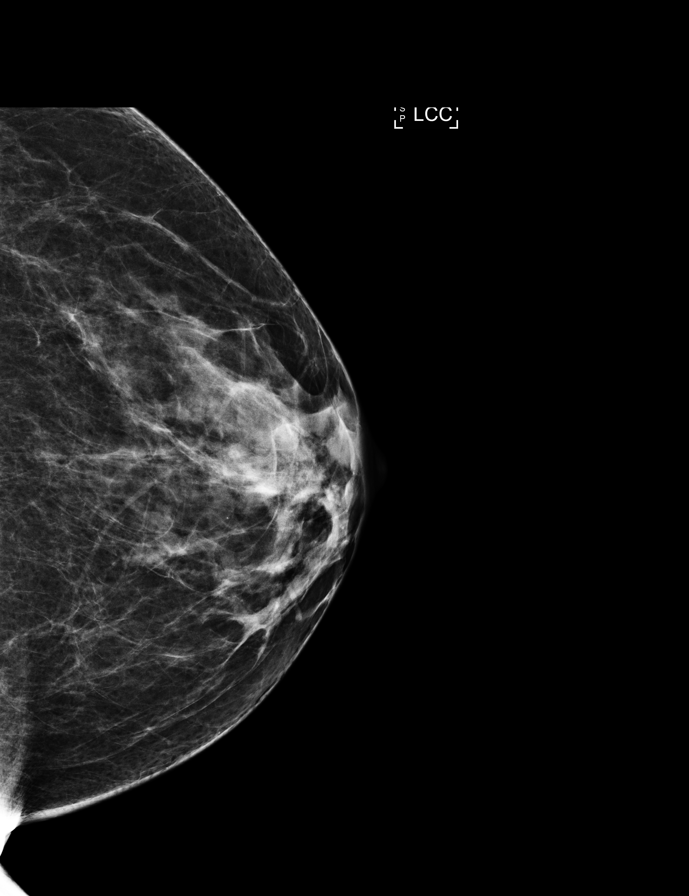

[L MLO synth-2D]
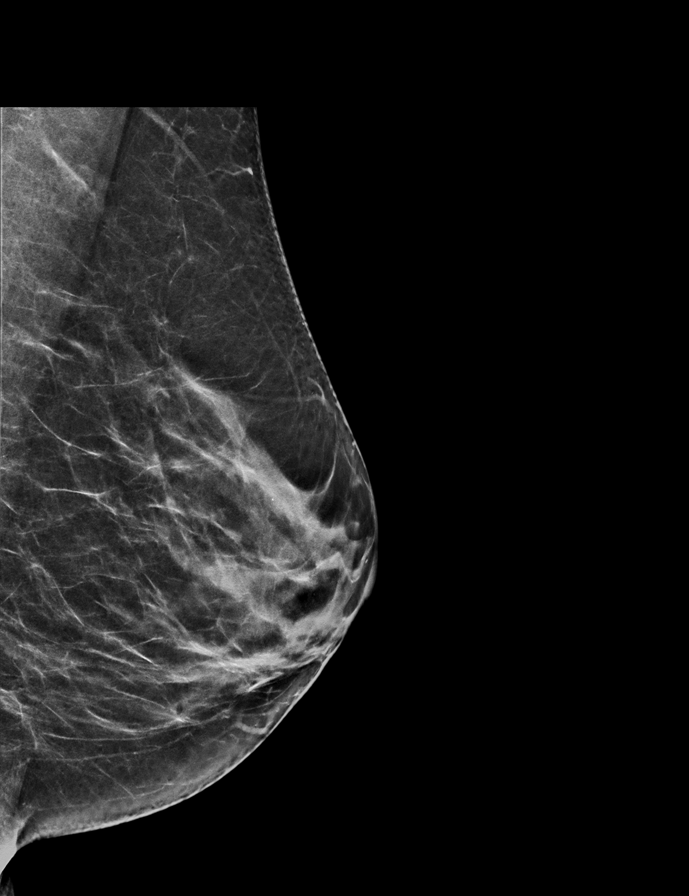

[R CC]
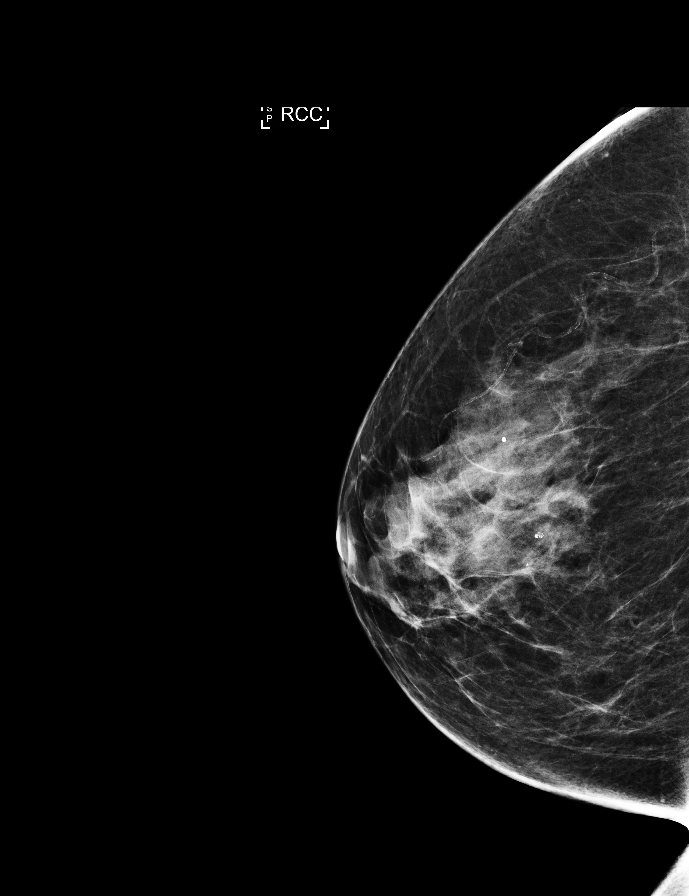

[L MLO]
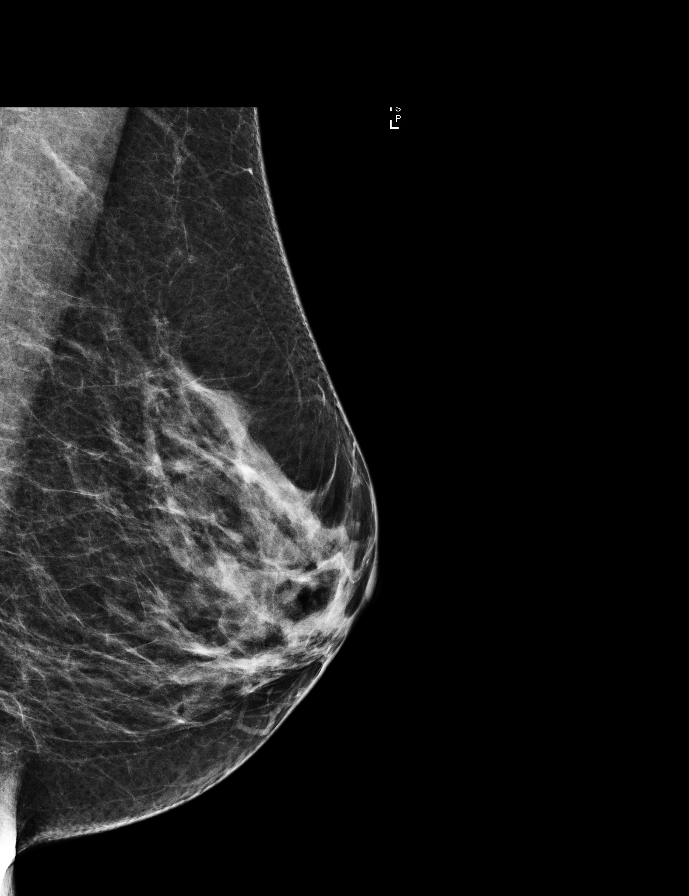

[L CC synth-2D]
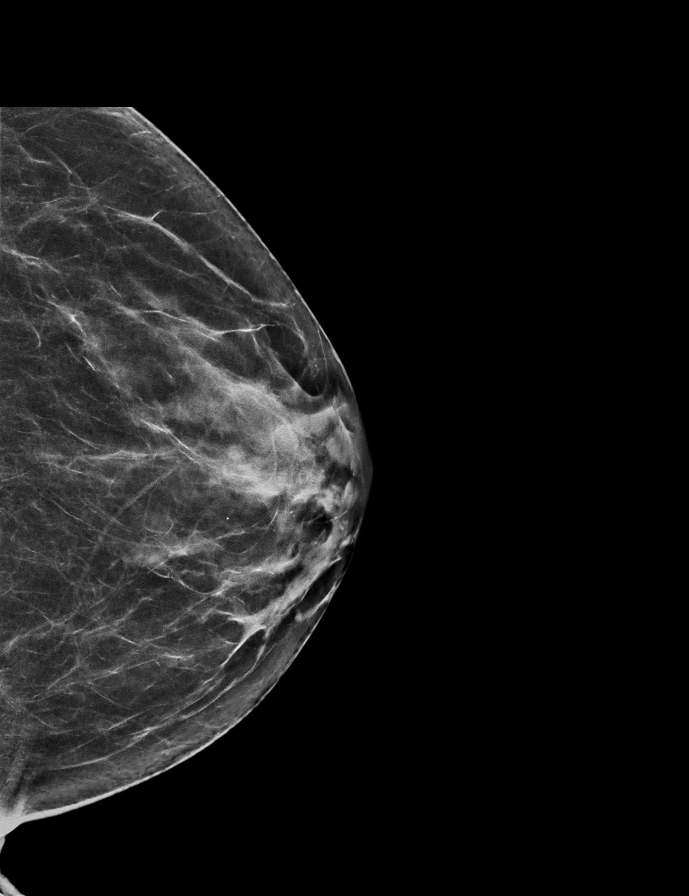

[R MLO synth-2D]
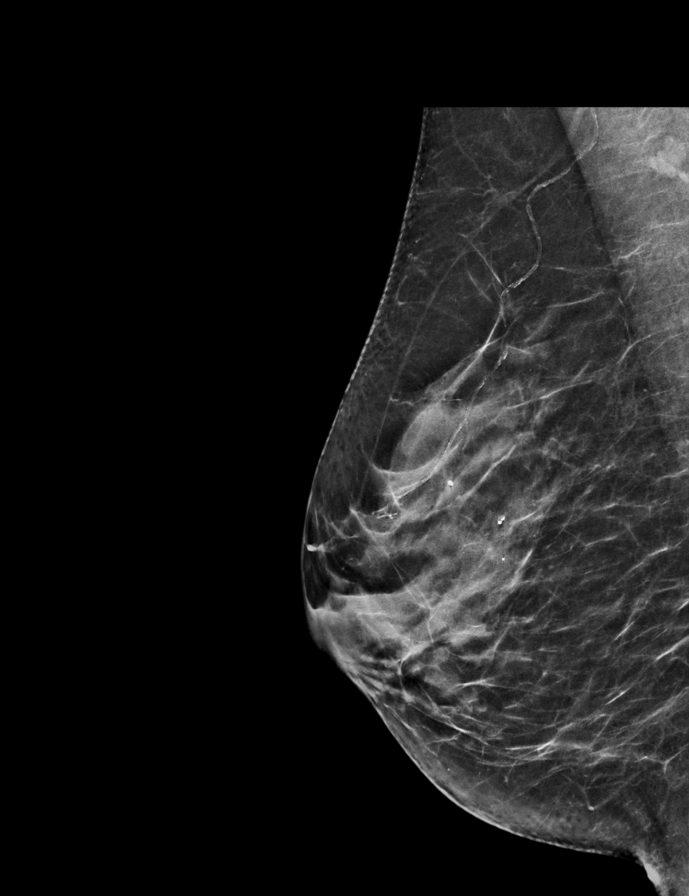

[8 of 28 positions shown; findings below may reference images not displayed]

ACR Breast Density Category c: The breast tissue is heterogeneously
dense, which may obscure small masses.
FINDINGS: No suspicious mass, distortion, or microcalcifications are
identified to suggest presence of malignancy.

On physical exam, when the patient sits upright, there is a 3 cm
linear indentation along the lower inner quadrant of the left
breast, oriented anti radially in the 7-8 o'clock location. In the
right breast, I cannot appreciate any dimpling with the patient
sitting or lying. I palpate no abnormality in the areas of concern
in either breast. No palpable cord identified in the area of linear
indentation in the left breast.

Targeted ultrasound is performed, showing normal appearing
fibroglandular tissue in the lower inner quadrant of the left
breast. There is no visible thrombosed vein to account for the
linear indentation. Ultrasound is performed of the medial, lower,
and lateral portions of the right breast, demonstrating no
sonographic abnormalities.
IMPRESSION: 1.  No mammographic or ultrasound evidence for malignancy.
2. No imaging finding to account for the linear indentation in the
lower inner quadrant of the left breast. Although the clinical
findings are similar to Mondor's disease, no sonographic evidence
for thrombosed vein. Followup is recommended.

RECOMMENDATION:
Follow-up left diagnostic mammogram and ultrasound suggested in 3
months.

I have discussed the findings and recommendations with the patient.
Results were also provided in writing at the conclusion of the
visit. If applicable, a reminder letter will be sent to the patient
regarding the next appointment.

BI-RADS CATEGORY  3: Probably benign.

## 2017-04-10 ENCOUNTER — Emergency Department (HOSPITAL_BASED_OUTPATIENT_CLINIC_OR_DEPARTMENT_OTHER)
Admit: 2017-04-10 | Discharge: 2017-04-10 | Disposition: A | Discharge status: Home / Self Care | Payer: BLUE CROSS/BLUE SHIELD | Attending: Physician Assistant | Admitting: Physician Assistant

## 2017-04-10 ENCOUNTER — Emergency Department (HOSPITAL_COMMUNITY)
Admission: EM | Admit: 2017-04-10 | Discharge: 2017-04-10 | Disposition: A | Discharge status: Home / Self Care | Payer: BLUE CROSS/BLUE SHIELD | Attending: Physician Assistant | Admitting: Physician Assistant

## 2017-04-10 ENCOUNTER — Emergency Department (HOSPITAL_COMMUNITY): Payer: BLUE CROSS/BLUE SHIELD

## 2017-04-10 ENCOUNTER — Encounter (HOSPITAL_COMMUNITY): Payer: Self-pay | Admitting: Adult Health

## 2017-04-10 DIAGNOSIS — Z87891 Personal history of nicotine dependence: Secondary | ICD-10-CM | POA: Diagnosis not present

## 2017-04-10 DIAGNOSIS — M7989 Other specified soft tissue disorders: Secondary | ICD-10-CM | POA: Diagnosis not present

## 2017-04-10 DIAGNOSIS — M79605 Pain in left leg: Secondary | ICD-10-CM | POA: Diagnosis present

## 2017-04-10 DIAGNOSIS — Z79899 Other long term (current) drug therapy: Secondary | ICD-10-CM | POA: Insufficient documentation

## 2017-04-10 DIAGNOSIS — R079 Chest pain, unspecified: Secondary | ICD-10-CM | POA: Insufficient documentation

## 2017-04-10 DIAGNOSIS — R42 Dizziness and giddiness: Secondary | ICD-10-CM | POA: Insufficient documentation

## 2017-04-10 LAB — BASIC METABOLIC PANEL
ANION GAP: 8 (ref 5–15)
BUN: 19 mg/dL (ref 6–20)
CHLORIDE: 104 mmol/L (ref 101–111)
CO2: 25 mmol/L (ref 22–32)
Calcium: 9.2 mg/dL (ref 8.9–10.3)
Creatinine, Ser: 1.06 mg/dL — ABNORMAL HIGH (ref 0.44–1.00)
GFR calc Af Amer: >60 mL/min (ref >60)
GFR calc non Af Amer: 57 mL/min — ABNORMAL LOW (ref >60)
GLUCOSE: 101 mg/dL — AB (ref 65–99)
POTASSIUM: 3.6 mmol/L (ref 3.5–5.1)
Sodium: 137 mmol/L (ref 135–145)

## 2017-04-10 LAB — CBC
HEMATOCRIT: 44.2 % (ref 36.0–46.0)
HEMOGLOBIN: 14.9 g/dL (ref 12.0–15.0)
MCH: 32 pg (ref 26.0–34.0)
MCHC: 33.7 g/dL (ref 30.0–36.0)
MCV: 95.1 fL (ref 78.0–100.0)
Platelets: 204 10*3/uL (ref 150–400)
RBC: 4.65 MIL/uL (ref 3.87–5.11)
RDW: 12.1 % (ref 11.5–15.5)
WBC: 6.4 10*3/uL (ref 4.0–10.5)

## 2017-04-10 LAB — I-STAT TROPONIN, ED: Troponin i, poc: 0 ng/mL (ref 0.00–0.08)

## 2017-04-10 MED ORDER — IOPAMIDOL (ISOVUE-370) INJECTION 76%
INTRAVENOUS | Status: AC
  Administered 2017-04-10: 100 mL
  Filled 2017-04-10: qty 100

## 2017-04-10 NOTE — ED Notes (Signed)
Patient transported to Ultrasound 

## 2017-04-10 NOTE — ED Triage Notes (Signed)
Presents with left leg pain that begins on medial side of calf and goes all the way up leg, described as pressure and weak and painful to walk or move. Pain is constant but worse with movement. Pain started one week ago after driving up to [place] a 14 hour drive. This week she began having chest pain intermittently and SOB. She denies cp and SOB at this time. No redness or injury to left leg.

## 2017-04-10 NOTE — ED Notes (Signed)
Pt transported to XR.  

## 2017-04-10 NOTE — Progress Notes (Signed)
*  PRELIMINARY RESULTS* Vascular Ultrasound Left lower extremity venous duplex has been completed.  Preliminary findings: No evidence of deep vein thrombosis or baker's cyst in the left lower extremity.   Chauncey FischerCharlotte C Jaquay Morneault 04/10/2017, 5:28 PM

## 2017-04-10 NOTE — ED Provider Notes (Signed)
[facility]-EMERGENCY DEPT Provider Note   CSN: [id number] Arrival date & time: 04/10/17  1534     History   Chief Complaint Chief Complaint  Patient presents with  . Leg Pain  . Chest Pain    HPI Valerie Reed is a 56 y.o. female.  HPI   Patietn is a 56 yo presenting with swelling/pain to the LLE. Started after long drive to [place].  Also has SOB.  Has been on estrogen for > 20 years, just stopped.   Patient endorses mild shortness of breath and occasional chest pain in the last weeks. This is made exertion worse on exertion.  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  Past Medical History:  Diagnosis Date  . Anxiety   . Anxiety disorder   . Bipolar disorder (HCC)   . Depression   . Diverticulosis   . Gastritis   . Helicobacter pylori gastritis 12/2006  . IBS (irritable bowel syndrome)   . Pericarditis   . S/P dilatation of esophageal stricture 2008  . UC (ulcerative colitis) (HCC)    Left sided     Patient Active Problem List   Diagnosis Date Noted  . History of osteopenia 06/23/2016  . Menopausal syndrome 06/23/2016  . Visual field loss 12/30/2013  . Headache 12/30/2013  . DIZZINESS 12/29/2009  . DYSPNEA 12/29/2009  . CHEST PAIN-UNSPECIFIED 12/29/2009  . 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 ULCERATIVE COLITIS, LEFT SIDED 05/19/2008  . DIVERTICULITIS, COLON 05/19/2008    Past Surgical History:  Procedure Laterality Date  . ABDOMINAL HYSTERECTOMY    . COLONOSCOPY    . DILATION AND CURETTAGE OF UTERUS    . ESOPHAGOGASTRODUODENOSCOPY    . HERNIA REPAIR    . TUBAL LIGATION    . Tummy Tuck      OB History    Gravida Para Term Preterm AB Living   3 3 3     3    SAB TAB Ectopic Multiple Live Births           3       Home Medications    Prior to Admission medications   Medication Sig Start Date End Date Taking? Authorizing Provider  Cholecalciferol (VITAMIN D3)  5000 units CAPS Take by mouth.   Yes [provider]  mesalamine (CANASA) 1000 MG suppository Place 1,000 mg rectally at bedtime.   Yes [provider]  Nettle, Urtica Dioica, (NETTLE LEAF) 435 MG CAPS Take 435 mg by mouth daily.   Yes [provider]  estradiol (VIVELLE-DOT) 0.0375 MG/24HR Place 1 patch onto the skin 2 (two) times a week. Patient not taking: Reported on 04/10/2017 06/23/16   Romualdo BolkJertson, Jill Evelyn, MD  phenazopyridine (PYRIDIUM) 200 MG tablet 1 tab po tid x 2 days Patient not taking: Reported on 04/10/2017 07/04/16   Romualdo BolkJertson, Jill Evelyn, MD  sulfamethoxazole-trimethoprim (BACTRIM DS) 800-160 MG tablet Take 1 tablet by mouth 2 (two) times daily. One PO BID x 3 days Patient not taking: Reported on 04/10/2017 07/04/16   Romualdo BolkJertson, Jill Evelyn, MD    Family History Family History  Problem Relation Age of Onset  . Diabetes Mother   . Heart disease Mother   . Irritable bowel syndrome Mother   . Alzheimer's disease Mother   . Melanoma Father   . Breast cancer Paternal Aunt     Social History Social History  Substance Use Topics  . Smoking status: Former Smoker  Quit date: 12/30/2005  . Smokeless tobacco: Never Used  . Alcohol use No     Allergies   Dairy aid [lactase]   Review of Systems Review of Systems  Constitutional: Negative for activity change.  Respiratory: Positive for chest tightness and shortness of breath.   Cardiovascular: Negative for chest pain.  Gastrointestinal: Negative for abdominal pain.  Neurological: Positive for light-headedness.  All other systems reviewed and are negative.    Physical Exam Updated Vital Signs BP 125/86   Pulse 71   Temp 98.8 F (37.1 C) (Oral)   Resp (!) 24   Ht 5\' 6"  (1.676 m)   Wt 62.6 kg (138 lb)   SpO2 100%   BMI 22.27 kg/m   Physical Exam  Constitutional: She is oriented to person, place, and time. She appears well-developed and well-nourished.  HENT:  Head: Normocephalic and  atraumatic.  Eyes: Right eye exhibits no discharge.  Cardiovascular: Normal rate, regular rhythm and normal heart sounds.   No murmur heard. Pulmonary/Chest: Effort normal and breath sounds normal. She has no wheezes. She has no rales.  Abdominal: Soft. She exhibits no distension. There is no tenderness.  Musculoskeletal:  Mild pain to palpation L calf, no color changes. + pulses sensation distally.  Neurological: She is oriented to person, place, and time.  Skin: Skin is warm and dry. She is not diaphoretic.  Psychiatric: She has a normal mood and affect.  Nursing note and vitals reviewed.    ED Treatments / Results  Labs (all labs ordered are listed, but only abnormal results are displayed) Labs Reviewed  BASIC METABOLIC PANEL - Abnormal; Notable for the following:       Result Value   Glucose, Bld 101 (*)    Creatinine, Ser 1.06 (*)    GFR calc non Af Amer 57 (*)    All other components within normal limits  CBC  I-STAT TROPOININ, ED    EKG  EKG Interpretation None       Radiology Dg Chest 2 View  Result Date: 04/10/2017 CLINICAL DATA:  Left leg pain. EXAM: CHEST  2 VIEW COMPARISON:  04/25/2016. FINDINGS: Mediastinum and hilar structures normal. Lungs are clear. Heart size normal. No pleural effusion or pneumothorax. Degenerative changes thoracic spine. IMPRESSION: No acute cardiopulmonary disease . Electronically Signed   By: Maisie Fus  Register   On: 04/10/2017 16:34    Procedures Procedures (including critical care time)  Medications Ordered in ED Medications - No data to display   Initial Impression / Assessment and Plan / ED Course  I have reviewed the triage vital signs and the nursing notes.  Pertinent labs & imaging results that were available during my care of the patient were reviewed by me and considered in my medical decision making (see chart for details).     Well-appearing female presenting with left leg swelling and pain. Concerning she recently  went on long car trip. We'll get ultrasound. Patient also complains of shortness of breath and chest pain. We'll get CT pulmonary embolism as well.   CT negative [place] negative.  Possible muscles strain/tear.  Patient ambulatory. Will have her rest, ice , elevate, follow up with PCP.    Final Clinical Impressions(s) / ED Diagnoses   Final diagnoses:  None    New Prescriptions New Prescriptions   No medications on file     Abelino Derrick, MD 04/10/17 2304

## 2017-04-10 NOTE — Discharge Instructions (Signed)
We anticipate this will get better in several days. If not better please follow-up with primary care physician.

## 2017-05-31 ENCOUNTER — Telehealth: Payer: Self-pay | Admitting: *Deleted

## 2017-05-31 NOTE — Telephone Encounter (Signed)
It was recommended she have a f/u left diagnostic mammogram and ultrasound last November. She is now even over due for her yearly mammogram. Please send a letter, stating that we would recommend she f/u for her diagnostic imaging and that she is now behind on even routine screening. Then close the encounter.

## 2017-05-31 NOTE — Telephone Encounter (Signed)
Patient in 04 recall. Spoke with patient and she states that she has decided not to do the follow up imaging that is recommended.  Please advise on recall status/ letter

## 2017-06-01 ENCOUNTER — Encounter: Payer: Self-pay | Admitting: *Deleted

## 2017-06-01 NOTE — Telephone Encounter (Signed)
Letter sent- recall removed -eh 

## 2017-09-16 DIAGNOSIS — K5120 Ulcerative (chronic) proctitis without complications: Secondary | ICD-10-CM | POA: Insufficient documentation

## 2018-07-19 ENCOUNTER — Encounter: Payer: Self-pay | Admitting: Obstetrics and Gynecology

## 2019-06-26 ENCOUNTER — Other Ambulatory Visit: Payer: Self-pay

## 2019-06-26 DIAGNOSIS — Z20822 Contact with and (suspected) exposure to covid-19: Secondary | ICD-10-CM

## 2019-06-27 DIAGNOSIS — I300 Acute nonspecific idiopathic pericarditis: Secondary | ICD-10-CM | POA: Insufficient documentation

## 2019-06-27 LAB — NOVEL CORONAVIRUS, NAA: SARS-CoV-2, NAA: NOT DETECTED

## 2019-12-25 ENCOUNTER — Encounter: Payer: BC Managed Care – PPO | Admitting: Obstetrics and Gynecology

## 2019-12-30 ENCOUNTER — Other Ambulatory Visit: Payer: Self-pay

## 2019-12-31 ENCOUNTER — Ambulatory Visit: Payer: BC Managed Care – PPO | Admitting: Obstetrics and Gynecology

## 2019-12-31 ENCOUNTER — Encounter: Payer: Self-pay | Admitting: Obstetrics and Gynecology

## 2019-12-31 VITALS — BP 122/68 | HR 59 | Temp 98.1°F | Ht 65.6 in | Wt 134.0 lb

## 2019-12-31 DIAGNOSIS — Z0000 Encounter for general adult medical examination without abnormal findings: Secondary | ICD-10-CM | POA: Diagnosis not present

## 2019-12-31 DIAGNOSIS — R5383 Other fatigue: Secondary | ICD-10-CM

## 2019-12-31 DIAGNOSIS — N952 Postmenopausal atrophic vaginitis: Secondary | ICD-10-CM

## 2019-12-31 DIAGNOSIS — Z8739 Personal history of other diseases of the musculoskeletal system and connective tissue: Secondary | ICD-10-CM | POA: Diagnosis not present

## 2019-12-31 DIAGNOSIS — Z113 Encounter for screening for infections with a predominantly sexual mode of transmission: Secondary | ICD-10-CM

## 2019-12-31 DIAGNOSIS — Z01419 Encounter for gynecological examination (general) (routine) without abnormal findings: Secondary | ICD-10-CM

## 2019-12-31 DIAGNOSIS — E559 Vitamin D deficiency, unspecified: Secondary | ICD-10-CM

## 2019-12-31 DIAGNOSIS — E2839 Other primary ovarian failure: Secondary | ICD-10-CM

## 2019-12-31 MED ORDER — ESTRADIOL 10 MCG VA TABS: 1.0000 | ORAL_TABLET | VAGINAL | 3 refills | Status: DC

## 2019-12-31 NOTE — Progress Notes (Signed)
59 y.o. [id number] Divorced White or Caucasian Not Hispanic or Latino female here for annual exam.  She would like to start vaginal estrogen for dryness.     She c/o episodes of extreme exhaustion, body aches, has had negative covid testing.  Sleep is up and down. Still has vasomotor symptoms. Mostly tolerable. Not sexually active.   No LMP recorded. Patient has had a hysterectomy.          Sexually active: No.  The current method of family planning is status post hysterectomy.    Exercising: Yes.    Biking, walking, yoga, HIIT Smoker:  no  Health Maintenance: Pap:  unsure History of abnormal Pap:  no MMG: 05/17/16 Bi rads 3 probably benign  BMD:  Long time ago  Colonoscopy: 2019 normal  TDaP:  Unsure, thinks UTD    reports that she quit smoking about 14 years ago. She has never used smokeless tobacco. She reports that she does not drink alcohol or use drugs. 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 Kids are grown, 7 grandchildren, local.   Past Medical History:  Diagnosis Date  . Anxiety   . Anxiety disorder   . Bipolar disorder (HCC)   . Depression   . Diverticulosis   . Gastritis   . Helicobacter pylori gastritis 12/2006  . IBS (irritable bowel syndrome)   . Pericarditis   . S/P dilatation of esophageal stricture 2008  . UC (ulcerative colitis) (HCC)    Left sided     Past Surgical History:  Procedure Laterality Date  . ABDOMINAL HYSTERECTOMY    . COLONOSCOPY    . DILATION AND CURETTAGE OF UTERUS    . ESOPHAGOGASTRODUODENOSCOPY    . HERNIA REPAIR    . TUBAL LIGATION    . Tummy Tuck      Current Outpatient Medications  Medication Sig Dispense Refill  . Nettle, Urtica Dioica, (NETTLE LEAF) 435 MG CAPS Take 435 mg by mouth daily.    . Omega-3 Fatty Acids (FISH OIL) 1000 MG CAPS Take by mouth.     No current facility-administered medications for this visit.    Family History  Problem Relation Age of Onset  . Diabetes Mother   . Heart disease  Mother   . Irritable bowel syndrome Mother   . Alzheimer's disease Mother   . Melanoma Father   . Breast cancer Paternal Aunt     Review of Systems  Allergic/Immunologic: Positive for environmental allergies.  All other systems reviewed and are negative.   Exam:   BP 122/68   Pulse (!) 59   Temp 98.1 F (36.7 C)   Ht 5' 5.6" (1.666 m)   Wt 134 lb (60.8 kg)   SpO2 97%   BMI 21.89 kg/m   Weight change: @WEIGHTCHANGE @ Height:   Height: 5' 5.6" (166.6 cm)  Ht Readings from Last 3 Encounters:  12/31/19 5' 5.6" (1.666 m)  04/10/17 5\' 6"  (1.676 m)  06/23/16 5' 5.5" (1.664 m)    General appearance: alert, cooperative and appears stated age Head: Normocephalic, without obvious abnormality, atraumatic Neck: no adenopathy, supple, symmetrical, trachea midline and thyroid normal to inspection and palpation Lungs: clear to auscultation bilaterally Cardiovascular: regular rate and rhythm Breasts: normal appearance, no masses or tenderness Abdomen: soft, non-tender; non distended,  no masses,  no organomegaly Extremities: extremities normal, atraumatic, no cyanosis or edema Skin: Skin color, texture, turgor normal. No rashes or lesions Lymph nodes: Cervical, supraclavicular, and axillary nodes normal. No abnormal inguinal  nodes palpated Neurologic: Grossly normal   Pelvic: External genitalia:  no lesions              Urethra:  normal appearing urethra with no masses, tenderness or lesions              Bartholins and Skenes: normal                 Vagina: normal appearing vagina with normal color and discharge, no lesions              Cervix: absent               Bimanual Exam:  Uterus:  uterus absent              Adnexa: no mass, fullness, tenderness               Rectovaginal: Confirms               Anus:  normal sphincter tone, no lesions  Terence Lux chaperoned for the exam.  A:  Well Woman with normal exam  Vaginal atrophy  Fatigue  Vit d def  P:   No pap  needed  Start vaginal estrogen  Screening labs, TSH, vit D  Screening STD  Mammogram overdue, # given  DEXA ordered  Discussed breast self exam  Discussed calcium and vit D intake

## 2019-12-31 NOTE — Patient Instructions (Signed)

## 2020-01-01 ENCOUNTER — Other Ambulatory Visit: Payer: Self-pay | Admitting: Obstetrics and Gynecology

## 2020-01-01 DIAGNOSIS — E2839 Other primary ovarian failure: Secondary | ICD-10-CM

## 2020-01-01 DIAGNOSIS — Z1231 Encounter for screening mammogram for malignant neoplasm of breast: Secondary | ICD-10-CM

## 2020-01-01 DIAGNOSIS — Z8739 Personal history of other diseases of the musculoskeletal system and connective tissue: Secondary | ICD-10-CM

## 2020-01-01 LAB — COMPREHENSIVE METABOLIC PANEL
ALT: 12 IU/L (ref 0–32)
AST: 25 IU/L (ref 0–40)
Albumin/Globulin Ratio: 1.7 (ref 1.2–2.2)
Albumin: 4.2 g/dL (ref 3.8–4.8)
Alkaline Phosphatase: 107 IU/L (ref 39–117)
BUN/Creatinine Ratio: 24 (ref 12–28)
BUN: 18 mg/dL (ref 8–27)
Bilirubin Total: 0.5 mg/dL (ref 0.0–1.2)
CO2: 26 mmol/L (ref 20–29)
Calcium: 9.3 mg/dL (ref 8.7–10.3)
Chloride: 103 mmol/L (ref 96–106)
Creatinine, Ser: 0.75 mg/dL (ref 0.57–1.00)
GFR calc Af Amer: 99 mL/min/{1.73_m2} (ref >59)
GFR calc non Af Amer: 86 mL/min/{1.73_m2} (ref >59)
Globulin, Total: 2.5 g/dL (ref 1.5–4.5)
Glucose: 87 mg/dL (ref 65–99)
Potassium: 4.4 mmol/L (ref 3.5–5.2)
Sodium: 144 mmol/L (ref 134–144)
Total Protein: 6.7 g/dL (ref 6.0–8.5)

## 2020-01-01 LAB — CBC
Hematocrit: 48.2 % — ABNORMAL HIGH (ref 34.0–46.6)
Hemoglobin: 16.1 g/dL — ABNORMAL HIGH (ref 11.1–15.9)
MCH: 33.2 pg — ABNORMAL HIGH (ref 26.6–33.0)
MCHC: 33.4 g/dL (ref 31.5–35.7)
MCV: 99 fL — ABNORMAL HIGH (ref 79–97)
Platelets: 187 10*3/uL (ref 150–450)
RBC: 4.85 x10E6/uL (ref 3.77–5.28)
RDW: 11.5 % — ABNORMAL LOW (ref 11.7–15.4)
WBC: 6.1 10*3/uL (ref 3.4–10.8)

## 2020-01-01 LAB — LIPID PANEL
Chol/HDL Ratio: 3.7 ratio (ref 0.0–4.4)
Cholesterol, Total: 210 mg/dL — ABNORMAL HIGH (ref 100–199)
HDL: 57 mg/dL (ref >39)
LDL Chol Calc (NIH): 121 mg/dL — ABNORMAL HIGH (ref 0–99)
Triglycerides: 183 mg/dL — ABNORMAL HIGH (ref 0–149)
VLDL Cholesterol Cal: 32 mg/dL (ref 5–40)

## 2020-01-01 LAB — GC/CHLAMYDIA PROBE AMP
Chlamydia trachomatis, NAA: NEGATIVE
Neisseria Gonorrhoeae by PCR: NEGATIVE

## 2020-01-01 LAB — VITAMIN D 25 HYDROXY (VIT D DEFICIENCY, FRACTURES): Vit D, 25-Hydroxy: 25.3 ng/mL — ABNORMAL LOW (ref 30.0–100.0)

## 2020-01-01 LAB — HIV ANTIBODY (ROUTINE TESTING W REFLEX): HIV Screen 4th Generation wRfx: NONREACTIVE

## 2020-01-01 LAB — RPR: RPR Ser Ql: NONREACTIVE

## 2020-01-01 LAB — TSH: TSH: 3 u[IU]/mL (ref 0.450–4.500)

## 2020-01-06 ENCOUNTER — Other Ambulatory Visit: Payer: Self-pay | Admitting: *Deleted

## 2020-01-06 DIAGNOSIS — R899 Unspecified abnormal finding in specimens from other organs, systems and tissues: Secondary | ICD-10-CM

## 2020-05-20 ENCOUNTER — Other Ambulatory Visit: Payer: BC Managed Care – PPO

## 2020-05-27 ENCOUNTER — Ambulatory Visit: Payer: BLUE CROSS/BLUE SHIELD

## 2020-05-27 ENCOUNTER — Other Ambulatory Visit: Payer: BLUE CROSS/BLUE SHIELD

## 2020-05-27 ENCOUNTER — Other Ambulatory Visit: Payer: Self-pay

## 2020-06-17 ENCOUNTER — Other Ambulatory Visit: Payer: Self-pay

## 2020-06-17 ENCOUNTER — Ambulatory Visit
Admission: RE | Admit: 2020-06-17 | Discharge: 2020-06-17 | Disposition: A | Discharge status: Home / Self Care | Payer: BC Managed Care – PPO | Source: Ambulatory Visit | Attending: Obstetrics and Gynecology | Admitting: Obstetrics and Gynecology

## 2020-06-17 DIAGNOSIS — Z1231 Encounter for screening mammogram for malignant neoplasm of breast: Secondary | ICD-10-CM

## 2020-06-24 ENCOUNTER — Other Ambulatory Visit: Payer: BC Managed Care – PPO

## 2020-07-28 DIAGNOSIS — L570 Actinic keratosis: Secondary | ICD-10-CM | POA: Diagnosis not present

## 2020-07-28 DIAGNOSIS — L281 Prurigo nodularis: Secondary | ICD-10-CM | POA: Diagnosis not present

## 2020-07-28 DIAGNOSIS — Z85828 Personal history of other malignant neoplasm of skin: Secondary | ICD-10-CM | POA: Diagnosis not present

## 2020-07-28 DIAGNOSIS — L718 Other rosacea: Secondary | ICD-10-CM | POA: Diagnosis not present

## 2020-07-28 DIAGNOSIS — L821 Other seborrheic keratosis: Secondary | ICD-10-CM | POA: Diagnosis not present

## 2020-12-01 ENCOUNTER — Other Ambulatory Visit: Payer: Self-pay | Admitting: Obstetrics and Gynecology

## 2020-12-15 DIAGNOSIS — F909 Attention-deficit hyperactivity disorder, unspecified type: Secondary | ICD-10-CM | POA: Diagnosis not present

## 2021-05-27 ENCOUNTER — Emergency Department (HOSPITAL_COMMUNITY): Payer: BC Managed Care – PPO

## 2021-05-27 ENCOUNTER — Emergency Department (HOSPITAL_COMMUNITY)
Admission: EM | Admit: 2021-05-27 | Discharge: 2021-05-28 | Disposition: A | Discharge status: Home / Self Care | Payer: BC Managed Care – PPO | Attending: Emergency Medicine | Admitting: Emergency Medicine

## 2021-05-27 ENCOUNTER — Other Ambulatory Visit: Payer: Self-pay

## 2021-05-27 DIAGNOSIS — I700 Atherosclerosis of aorta: Secondary | ICD-10-CM | POA: Diagnosis not present

## 2021-05-27 DIAGNOSIS — S060X0A Concussion without loss of consciousness, initial encounter: Secondary | ICD-10-CM | POA: Diagnosis not present

## 2021-05-27 DIAGNOSIS — K5730 Diverticulosis of large intestine without perforation or abscess without bleeding: Secondary | ICD-10-CM | POA: Diagnosis not present

## 2021-05-27 DIAGNOSIS — K7689 Other specified diseases of liver: Secondary | ICD-10-CM | POA: Diagnosis not present

## 2021-05-27 DIAGNOSIS — Y92410 Unspecified street and highway as the place of occurrence of the external cause: Secondary | ICD-10-CM | POA: Insufficient documentation

## 2021-05-27 DIAGNOSIS — Z87891 Personal history of nicotine dependence: Secondary | ICD-10-CM | POA: Diagnosis not present

## 2021-05-27 DIAGNOSIS — T1490XA Injury, unspecified, initial encounter: Secondary | ICD-10-CM

## 2021-05-27 DIAGNOSIS — S0990XA Unspecified injury of head, initial encounter: Secondary | ICD-10-CM | POA: Diagnosis not present

## 2021-05-27 DIAGNOSIS — S0993XA Unspecified injury of face, initial encounter: Secondary | ICD-10-CM | POA: Diagnosis not present

## 2021-05-27 DIAGNOSIS — J984 Other disorders of lung: Secondary | ICD-10-CM | POA: Diagnosis not present

## 2021-05-27 DIAGNOSIS — M25551 Pain in right hip: Secondary | ICD-10-CM | POA: Insufficient documentation

## 2021-05-27 DIAGNOSIS — R339 Retention of urine, unspecified: Secondary | ICD-10-CM | POA: Diagnosis not present

## 2021-05-27 DIAGNOSIS — M542 Cervicalgia: Secondary | ICD-10-CM | POA: Insufficient documentation

## 2021-05-27 DIAGNOSIS — R52 Pain, unspecified: Secondary | ICD-10-CM

## 2021-05-27 DIAGNOSIS — R9431 Abnormal electrocardiogram [ECG] [EKG]: Secondary | ICD-10-CM | POA: Diagnosis not present

## 2021-05-27 DIAGNOSIS — M25552 Pain in left hip: Secondary | ICD-10-CM | POA: Insufficient documentation

## 2021-05-27 DIAGNOSIS — R519 Headache, unspecified: Secondary | ICD-10-CM | POA: Diagnosis not present

## 2021-05-27 DIAGNOSIS — M545 Low back pain, unspecified: Secondary | ICD-10-CM | POA: Diagnosis not present

## 2021-05-27 DIAGNOSIS — Z041 Encounter for examination and observation following transport accident: Secondary | ICD-10-CM | POA: Diagnosis not present

## 2021-05-27 DIAGNOSIS — R55 Syncope and collapse: Secondary | ICD-10-CM | POA: Diagnosis not present

## 2021-05-27 DIAGNOSIS — I2510 Atherosclerotic heart disease of native coronary artery without angina pectoris: Secondary | ICD-10-CM | POA: Diagnosis not present

## 2021-05-27 LAB — CBC
HCT: 43.6 % (ref 36.0–46.0)
Hemoglobin: 14.7 g/dL (ref 12.0–15.0)
MCH: 32.3 pg (ref 26.0–34.0)
MCHC: 33.7 g/dL (ref 30.0–36.0)
MCV: 95.8 fL (ref 80.0–100.0)
Platelets: 186 10*3/uL (ref 150–400)
RBC: 4.55 MIL/uL (ref 3.87–5.11)
RDW: 11.3 % — ABNORMAL LOW (ref 11.5–15.5)
WBC: 7.6 10*3/uL (ref 4.0–10.5)
nRBC: 0 % (ref 0.0–0.2)

## 2021-05-27 LAB — BASIC METABOLIC PANEL
Anion gap: 9 (ref 5–15)
BUN: 22 mg/dL (ref 8–23)
CO2: 25 mmol/L (ref 22–32)
Calcium: 9.2 mg/dL (ref 8.9–10.3)
Chloride: 105 mmol/L (ref 98–111)
Creatinine, Ser: 0.88 mg/dL (ref 0.44–1.00)
GFR, Estimated: >60 mL/min (ref >60)
Glucose, Bld: 92 mg/dL (ref 70–99)
Potassium: 3.7 mmol/L (ref 3.5–5.1)
Sodium: 139 mmol/L (ref 135–145)

## 2021-05-27 MED ORDER — SODIUM CHLORIDE 0.9 % IV BOLUS
500.0000 mL | Freq: Once | INTRAVENOUS | Status: AC
  Administered 2021-05-27: 500 mL via INTRAVENOUS

## 2021-05-27 MED ORDER — MORPHINE SULFATE (PF) 4 MG/ML IV SOLN
4.0000 mg | Freq: Once | INTRAVENOUS | Status: AC
  Administered 2021-05-27: 4 mg via INTRAVENOUS
  Filled 2021-05-27: qty 1

## 2021-05-27 MED ORDER — ONDANSETRON HCL 4 MG/2ML IJ SOLN: 4.0000 mg | Freq: Once | INTRAMUSCULAR | Status: DC

## 2021-05-27 MED ORDER — IOHEXOL 350 MG/ML SOLN
75.0000 mL | Freq: Once | INTRAVENOUS | Status: AC | PRN
  Administered 2021-05-27: 75 mL via INTRAVENOUS
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
## 2021-05-27 NOTE — ED Notes (Signed)
Pt able to void on bedpan unassisted.
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
## 2021-06-08 DIAGNOSIS — I700 Atherosclerosis of aorta: Secondary | ICD-10-CM | POA: Diagnosis not present

## 2021-06-08 DIAGNOSIS — E041 Nontoxic single thyroid nodule: Secondary | ICD-10-CM | POA: Diagnosis not present

## 2021-06-08 DIAGNOSIS — S0990XD Unspecified injury of head, subsequent encounter: Secondary | ICD-10-CM | POA: Diagnosis not present

## 2021-06-08 DIAGNOSIS — W19XXXD Unspecified fall, subsequent encounter: Secondary | ICD-10-CM | POA: Diagnosis not present

## 2021-06-30 DIAGNOSIS — F411 Generalized anxiety disorder: Secondary | ICD-10-CM | POA: Diagnosis not present

## 2021-06-30 DIAGNOSIS — F09 Unspecified mental disorder due to known physiological condition: Secondary | ICD-10-CM | POA: Diagnosis not present

## 2021-06-30 DIAGNOSIS — E559 Vitamin D deficiency, unspecified: Secondary | ICD-10-CM | POA: Diagnosis not present

## 2021-06-30 DIAGNOSIS — R002 Palpitations: Secondary | ICD-10-CM | POA: Diagnosis not present

## 2021-06-30 DIAGNOSIS — S0990XD Unspecified injury of head, subsequent encounter: Secondary | ICD-10-CM | POA: Diagnosis not present

## 2021-06-30 DIAGNOSIS — M5412 Radiculopathy, cervical region: Secondary | ICD-10-CM | POA: Diagnosis not present

## 2021-07-26 ENCOUNTER — Other Ambulatory Visit: Payer: Self-pay | Admitting: Family Medicine

## 2021-07-26 DIAGNOSIS — G4730 Sleep apnea, unspecified: Secondary | ICD-10-CM | POA: Diagnosis not present

## 2021-07-26 DIAGNOSIS — E041 Nontoxic single thyroid nodule: Secondary | ICD-10-CM

## 2021-07-28 DIAGNOSIS — L814 Other melanin hyperpigmentation: Secondary | ICD-10-CM | POA: Diagnosis not present

## 2021-07-28 DIAGNOSIS — Z85828 Personal history of other malignant neoplasm of skin: Secondary | ICD-10-CM | POA: Diagnosis not present

## 2021-07-28 DIAGNOSIS — L570 Actinic keratosis: Secondary | ICD-10-CM | POA: Diagnosis not present

## 2021-07-28 DIAGNOSIS — L821 Other seborrheic keratosis: Secondary | ICD-10-CM | POA: Diagnosis not present

## 2021-08-04 DIAGNOSIS — R400 Somnolence: Secondary | ICD-10-CM | POA: Diagnosis not present

## 2021-08-04 DIAGNOSIS — G4733 Obstructive sleep apnea (adult) (pediatric): Secondary | ICD-10-CM | POA: Diagnosis not present

## 2021-08-04 DIAGNOSIS — G40109 Localization-related (focal) (partial) symptomatic epilepsy and epileptic syndromes with simple partial seizures, not intractable, without status epilepticus: Secondary | ICD-10-CM | POA: Diagnosis not present

## 2021-08-09 ENCOUNTER — Ambulatory Visit
Admission: RE | Admit: 2021-08-09 | Discharge: 2021-08-09 | Disposition: A | Discharge status: Home / Self Care | Payer: BC Managed Care – PPO | Source: Ambulatory Visit | Attending: Family Medicine | Admitting: Family Medicine

## 2021-08-09 DIAGNOSIS — E041 Nontoxic single thyroid nodule: Secondary | ICD-10-CM

## 2021-08-13 ENCOUNTER — Encounter: Payer: Self-pay | Admitting: Neurology

## 2021-08-13 ENCOUNTER — Ambulatory Visit (INDEPENDENT_AMBULATORY_CARE_PROVIDER_SITE_OTHER): Payer: BC Managed Care – PPO | Admitting: Neurology

## 2021-08-13 VITALS — BP 118/63 | HR 64 | Ht 66.0 in | Wt 133.0 lb

## 2021-08-13 DIAGNOSIS — R002 Palpitations: Secondary | ICD-10-CM

## 2021-08-13 DIAGNOSIS — N951 Menopausal and female climacteric states: Secondary | ICD-10-CM

## 2021-08-13 DIAGNOSIS — G4730 Sleep apnea, unspecified: Secondary | ICD-10-CM

## 2021-08-13 DIAGNOSIS — S069XAA Unspecified intracranial injury with loss of consciousness status unknown, initial encounter: Secondary | ICD-10-CM | POA: Insufficient documentation

## 2021-08-13 DIAGNOSIS — Z73819 Behavioral insomnia of childhood, unspecified type: Secondary | ICD-10-CM

## 2021-08-13 DIAGNOSIS — S069X1A Unspecified intracranial injury with loss of consciousness of 30 minutes or less, initial encounter: Secondary | ICD-10-CM

## 2021-08-13 MED ORDER — TRAZODONE HCL 50 MG PO TABS: 50.0000 mg | ORAL_TABLET | Freq: Every evening | ORAL | 0 refills | Status: DC | PRN
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
## 2021-08-13 NOTE — Patient Instructions (Signed)
Quality Sleep Information, Adult Quality sleep is important for your mental and physical health. It also improves your quality of life. Quality sleep means you: Are asleep for most of the time you are in bed. Fall asleep within 30 minutes. Wake up no more than once a night.  Are awake for no longer than 20 minutes if you do wake up during the night. Most adults need 7-8 hours of quality sleep each night. How can poor sleep affect me? If you do not get enough quality sleep, you may have: Mood swings. Daytime sleepiness. Confusion. Decreased reaction time. Sleep disorders, such as insomnia and sleep apnea. Difficulty with: Solving problems. Coping with stress. Paying attention. These issues may affect your performance and productivity at work, school, and at home. Lack of sleep may also put you at higher risk for accidents, suicide, and risky behaviors. If you do not get quality sleep you may also be at higher risk for several health problems, including: Infections. Type 2 diabetes. Heart disease. High blood pressure. Obesity. Worsening of long-term conditions, like arthritis, kidney disease, depression, Parkinson's disease, and epilepsy. What actions can I take to get more quality sleep?   Stick to a sleep schedule. Go to sleep and wake up at about the same time each day. Do not try to sleep less on weekdays and make up for lost sleep on weekends. This does not work. Try to get about 30 minutes of exercise on most days. Do not exercise 2-3 hours before going to bed. Limit naps during the day to 30 minutes or less. Do not use any products that contain nicotine or tobacco, such as cigarettes or e-cigarettes. If you need help quitting, ask your health care provider. Do not drink caffeinated beverages for at least 8 hours before going to bed. Coffee, tea, and some sodas contain caffeine. Do not drink alcohol close to bedtime. Do not eat large meals close to bedtime. Do not take naps in  the late afternoon. Try to get at least 30 minutes of sunlight every day. Morning sunlight is best. Make time to relax before bed. Reading, listening to music, or taking a hot bath promotes quality sleep. Make your bedroom a place that promotes quality sleep. Keep your bedroom dark, quiet, and at a comfortable room temperature. Make sure your bed is comfortable. Take out sleep distractions like TV, a computer, smartphone, and bright lights. If you are lying awake in bed for longer than 20 minutes, get up and do a relaxing activity until you feel sleepy. Work with your health care provider to treat medical conditions that may affect sleeping, such as: Nasal obstruction. Snoring. Sleep apnea and other sleep disorders. Talk to your health care provider if you think any of your prescription medicines may cause you to have difficulty falling or staying asleep. If you have sleep problems, talk with a sleep consultant. If you think you have a sleep disorder, talk with your health care provider about getting evaluated by a specialist. Where to find more information National Sleep Foundation website: https://sleepfoundation.org National Heart, Lung, and Blood Institute (NHLBI): www.nhlbi.nih.gov/files/docs/public/sleep/healthy_sleep.pdf Centers for Disease Control and Prevention (CDC): www.cdc.gov/sleep/index.html Contact a health care provider if you: Have trouble getting to sleep or staying asleep. Often wake up very early in the morning and cannot get back to sleep. Have daytime sleepiness. Have daytime sleep attacks of suddenly falling asleep and sudden muscle weakness (narcolepsy). Have a tingling sensation in your legs with a strong urge to move your legs (restless   legs syndrome). Stop breathing briefly during sleep (sleep apnea). Think you have a sleep disorder or are taking a medicine that is affecting your quality of sleep. Summary Most adults need 7-8 hours of quality sleep each  night. Getting enough quality sleep is an important part of health and well-being. Make your bedroom a place that promotes quality sleep and avoid things that may cause you to have poor sleep, such as alcohol, caffeine, smoking, and large meals. Talk to your health care provider if you have trouble falling asleep or staying asleep. This information is not intended to replace advice given to you by your health care provider. Make sure you discuss any questions you have with your health care provider. Document Revised: 12/20/2017 Document Reviewed: 12/20/2017 Elsevier Patient Education  [place]. Trazodone Tablets What is this medication? TRAZODONE (TRAZ oh done) treats depression. It increases the amount of serotonin in the brain, a hormone that helps regulate mood. This medicine may be used for other purposes; ask your health care provider or pharmacist if you have questions. COMMON BRAND NAME(S): Desyrel What should I tell my care team before I take this medication? They need to know if you have any of these conditions: Attempted suicide or thinking about it Bipolar disorder Bleeding problems Glaucoma Heart disease, or previous heart attack Irregular heart beat Kidney or liver disease Low levels of sodium in the blood An unusual or allergic reaction to trazodone, other medications, foods, dyes or preservatives Pregnant or trying to get pregnant Breast-feeding How should I use this medication? Take this medication by mouth with a glass of water. Follow the directions on the prescription label. Take this medication shortly after a meal or a light snack. Take your medication at regular intervals. Do not take your medication more often than directed. Do not stop taking this medication suddenly except upon the advice of your care team. Stopping this medication too quickly may cause serious side effects or your condition may worsen. A special MedGuide will be given to you by the  pharmacist with each prescription and refill. Be sure to read this information carefully each time. Talk to your care team regarding the use of this medication in children. Special care may be needed. Overdosage: If you think you have taken too much of this medicine contact a poison control center or emergency room at once. NOTE: This medicine is only for you. Do not share this medicine with others. What if I miss a dose? If you miss a dose, take it as soon as you can. If it is almost time for your next dose, take only that dose. Do not take double or extra doses. What may interact with this medication? Do not take this medication with any of the following: Certain medications for fungal infections like fluconazole, itraconazole, ketoconazole, posaconazole, voriconazole Cisapride Dronedarone Linezolid MAOIs like Carbex, Eldepryl, Marplan, Nardil, and Parnate Mesoridazine Methylene blue (injected into a vein) Pimozide Saquinavir Thioridazine This medication may also interact with the following: Alcohol Antiviral medications for HIV or AIDS Aspirin and aspirin-like medications Barbiturates like phenobarbital Certain medications for blood pressure, heart disease, irregular heart beat Certain medications for depression, anxiety, or psychotic disturbances Certain medications for migraine headache like almotriptan, eletriptan, frovatriptan, naratriptan, rizatriptan, sumatriptan, zolmitriptan Certain medications for seizures like carbamazepine and phenytoin Certain medications for sleep Certain medications that treat or prevent blood clots like dalteparin, enoxaparin, warfarin Digoxin Fentanyl Lithium NSAIDS, medications for pain and inflammation, like ibuprofen or naproxen Other medications that prolong the  QT interval (cause an abnormal heart rhythm) like dofetilide Rasagiline Supplements like St. John's wort, kava kava, valerian Tramadol Tryptophan This list may not describe all  possible interactions. Give your health care provider a list of all the medicines, herbs, non-prescription drugs, or dietary supplements you use. Also tell them if you smoke, drink alcohol, or use illegal drugs. Some items may interact with your medicine. What should I watch for while using this medication? Tell your care team if your symptoms do not get better or if they get worse. Visit your care team for regular checks on your progress. Because it may take several weeks to see the full effects of this medication, it is important to continue your treatment as prescribed by your care team. Watch for new or worsening thoughts of suicide or depression. This includes sudden changes in mood, behaviors, or thoughts. These changes can happen at any time but are more common in the beginning of treatment or after a change in dose. Call your care team right away if you experience these thoughts or worsening depression. Manic episodes may happen in patients with bipolar disorder who take this medication. Watch for changes in feelings or behaviors such as feeling anxious, nervous, agitated, panicky, irritable, hostile, aggressive, impulsive, severely restless, overly excited and hyperactive, or trouble sleeping. These changes can happen at any time but are more common in the beginning of treatment or after a change in dose. Call your care team right away if you notice any of these symptoms. You may get drowsy or dizzy. Do not drive, use machinery, or do anything that needs mental alertness until you know how this medication affects you. Do not stand or sit up quickly, especially if you are an older patient. This reduces the risk of dizzy or fainting spells. Alcohol may interfere with the effect of this medication. Avoid alcoholic drinks. This medication may cause dry eyes and blurred vision. If you wear contact lenses you may feel some discomfort. Lubricating drops may help. See your eye doctor if the problem does not  go away or is severe. Your mouth may get dry. Chewing sugarless gum, sucking hard candy and drinking plenty of water may help. Contact your care team if the problem does not go away or is severe. What side effects may I notice from receiving this medication? Side effects that you should report to your care team as soon as possible: Allergic reactions--skin rash, itching, hives, swelling of the face, lips, tongue, or throat Bleeding--bloody or black, tar-like stools, red or dark brown urine, vomiting blood or brown material that looks like coffee grounds, small, red or purple spots on skin, unusual bleeding or bruising Heart rhythm changes--fast or irregular heartbeat, dizziness, feeling faint or lightheaded, chest pain, trouble breathing Low blood pressure--dizziness, feeling faint or lightheaded, blurry vision Low sodium level--muscle weakness, fatigue, dizziness, headache, confusion Prolonged or painful erection Serotonin syndrome--irritability, confusion, fast or irregular heartbeat, muscle stiffness, twitching muscles, sweating, high fever, seizures, chills, vomiting, diarrhea Sudden eye pain or change in vision such as blurry vision, seeing halos around lights, vision loss Thoughts of suicide or self-harm, worsening mood, feelings of depression Side effects that usually do not require medical attention (report to your care team if they continue or are bothersome): Change in sex drive or performance Constipation Dizziness Drowsiness Dry mouth This list may not describe all possible side effects. Call your doctor for medical advice about side effects. You may report side effects to FDA at 1-800-FDA-1088. Where should I  keep my medication? Keep out of the reach of children and pets. Store at room temperature between 15 and 30 degrees C (59 to 86 degrees F). Protect from light. Keep container tightly closed. Throw away any unused medication after the expiration date. NOTE: This sheet is a  summary. It may not cover all possible information. If you have questions about this medicine, talk to your doctor, pharmacist, or health care provider.  2022 Elsevier/Gold Standard (2020-09-02 00:00:00)

## 2021-08-16 ENCOUNTER — Telehealth: Payer: Self-pay | Admitting: Neurology

## 2021-08-16 NOTE — Telephone Encounter (Signed)
ERROR

## 2021-08-26 ENCOUNTER — Ambulatory Visit (INDEPENDENT_AMBULATORY_CARE_PROVIDER_SITE_OTHER): Payer: BC Managed Care – PPO | Admitting: Cardiovascular Disease

## 2021-08-26 ENCOUNTER — Encounter (HOSPITAL_BASED_OUTPATIENT_CLINIC_OR_DEPARTMENT_OTHER): Payer: Self-pay | Admitting: Cardiovascular Disease

## 2021-08-26 ENCOUNTER — Other Ambulatory Visit: Payer: Self-pay

## 2021-08-26 DIAGNOSIS — I300 Acute nonspecific idiopathic pericarditis: Secondary | ICD-10-CM

## 2021-08-26 DIAGNOSIS — R002 Palpitations: Secondary | ICD-10-CM

## 2021-08-26 NOTE — Assessment & Plan Note (Signed)
She was having palpitations almost every night but they seem to have resolved.  No clear identifying triggers.  She did have a home sleep study that was abnormal but this seems odd given her body habitus and lack of other symptoms.  She has been seen by neurology and is already going to get an outpatient sleep study.  We will get a copy of the labs from Dr. Hal Hope to make sure she has had a CMP, CBC, magnesium, and TSH we did discuss ways to monitor her palpitations but they do not seem to be happening very often now.  She will consider getting a Kardia mobile device.  If it starts happening more frequently we can also mail her a ZIO.

## 2021-08-26 NOTE — Assessment & Plan Note (Signed)
She has had recurrent pericarditis but none recently.  Current symptoms seem very different than that.  She did respond to colchicine previously and was seen by Dr. Daleen Squibb in the past.
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
## 2021-08-26 NOTE — Patient Instructions (Addendum)
Consider getting an [employer] Advice worker) Device to monitor your palpitations.  Medication Instructions:  Your physician recommends that you continue on your current medications as directed. Please refer to the Current Medication list given to you today.  *If you need a refill on your cardiac medications before your next appointment, please call your pharmacy*  Lab Work: WILL REQUEST LAST LABS FROM PCP   Testing/Procedures: NONE   Follow-Up: At [facility], you and your health needs are our priority.  As part of our continuing mission to provide you with exceptional heart care, we have created designated Provider Care Teams.  These Care Teams include your primary Cardiologist (physician) and Advanced Practice Providers (APPs -  Physician Assistants and Nurse Practitioners) who all work together to provide you with the care you need, when you need it.  We recommend signing up for the patient portal called "MyChart".  Sign up information is provided on this After Visit Summary.  MyChart is used to connect with patients for Virtual Visits (Telemedicine).  Patients are able to view lab/test results, encounter notes, upcoming appointments, etc.  Non-urgent messages can be sent to your provider as well.   To learn more about what you can do with MyChart, go to [email or web address].    Your next appointment:   6 month(s)  The format for your next appointment:   In Person  Provider:   Chilton Si, MD

## 2021-09-07 NOTE — Progress Notes (Deleted)
60 y.o. G74P3003 Divorced White or Caucasian Not Hispanic or Latino female here for annual exam.      No LMP recorded. Patient has had a hysterectomy.          Sexually active: {yes no:314532}  The current method of family planning is {contraception:315051}.    Exercising: {yes no:314532}  {types:19826} Smoker:  {YES NO:22349}  Health Maintenance: Pap:  unsure   History of abnormal Pap:  no MMG:  06/17/20 density c Bi-rads 1 neg  BMD:   years ago  Colonoscopy: 2019 normal  TDaP:  Thinks it's up to date  Gardasil: n/a   reports that she quit smoking about 15 years ago. Her smoking use included cigarettes. She has never used smokeless tobacco. She reports that she does not drink alcohol and does not use drugs.  Past Medical History:  Diagnosis Date   Anxiety    Anxiety disorder    Bipolar disorder ([place])    Depression    Diverticulosis    Gastritis    Helicobacter pylori gastritis 12/2006   IBS (irritable bowel syndrome)    Pericarditis    S/P dilatation of esophageal stricture 2008   UC (ulcerative colitis) ([place])    Left sided     Past Surgical History:  Procedure Laterality Date   ABDOMINAL HYSTERECTOMY     COLONOSCOPY     DILATION AND CURETTAGE OF UTERUS     ESOPHAGOGASTRODUODENOSCOPY     HERNIA REPAIR     TUBAL LIGATION     Tummy Tuck      Current Outpatient Medications  Medication Sig Dispense Refill   diphenhydrAMINE HCl, Sleep, (SIMPLY SLEEP PO) Take 1 capsule by mouth at bedtime as needed (sleep).     traZODone (DESYREL) 50 MG tablet Take 1 tablet (50 mg total) by mouth at bedtime as needed for sleep. 30 tablet 0   VAGIFEM 10 MCG TABS vaginal tablet INSERT 1 TABLET(10 MCG) VAGINALLY 2 TIMES A WEEK (Patient taking differently: Place 10 mcg vaginally See admin instructions. Thursday and sunday) 24 tablet 3   No current facility-administered medications for this visit.    Family History  Problem Relation Age of Onset   Hypertension Mother    Diabetes Mother     Heart disease Mother    Irritable bowel syndrome Mother    Alzheimer's disease Mother    Melanoma Father    Heart attack Brother 55   Breast cancer Paternal Aunt    Hypertension Maternal Grandmother    Heart attack Paternal Grandmother    Hypertension Paternal Grandmother     Review of Systems  Exam:   There were no vitals taken for this visit.  Weight change: @WEIGHTCHANGE @ Height:      Ht Readings from Last 3 Encounters:  08/26/21 5\' 6"  (1.676 m)  08/13/21 5\' 6"  (1.676 m)  05/27/21 5\' 6"  (1.676 m)    General appearance: alert, cooperative and appears stated age Head: Normocephalic, without obvious abnormality, atraumatic Neck: no adenopathy, supple, symmetrical, trachea midline and thyroid {CHL AMB PHY EX THYROID NORM DEFAULT:[phone]::"normal to inspection and palpation"} Lungs: clear to auscultation bilaterally Cardiovascular: regular rate and rhythm Breasts: {Exam; breast:13139::"normal appearance, no masses or tenderness"} Abdomen: soft, non-tender; non distended,  no masses,  no organomegaly Extremities: extremities normal, atraumatic, no cyanosis or edema Skin: Skin color, texture, turgor normal. No rashes or lesions Lymph nodes: Cervical, supraclavicular, and axillary nodes normal. No abnormal inguinal nodes palpated Neurologic: Grossly normal   Pelvic: External genitalia:  no lesions  Urethra:  normal appearing urethra with no masses, tenderness or lesions              Bartholins and Skenes: normal                 Vagina: normal appearing vagina with normal color and discharge, no lesions              Cervix: {CHL AMB PHY EX CERVIX NORM [phone]::"no lesions"}               Bimanual Exam:  Uterus:  {CHL AMB PHY EX UTERUS NORM [phone]::"normal size, contour, position, consistency, mobility, non-tender"}              Adnexa: {CHL AMB PHY EX ADNEXA NO MASS DEFAULT:[phone]::"no mass, fullness, tenderness"}                Rectovaginal: Confirms               Anus:  normal sphincter tone, no lesions  *** chaperoned for the exam.  A:  Well Woman with normal exam  P:

## 2021-09-09 ENCOUNTER — Other Ambulatory Visit: Payer: Self-pay | Admitting: Neurology

## 2021-09-09 ENCOUNTER — Other Ambulatory Visit: Payer: Self-pay

## 2021-09-09 ENCOUNTER — Ambulatory Visit (INDEPENDENT_AMBULATORY_CARE_PROVIDER_SITE_OTHER): Payer: BC Managed Care – PPO | Admitting: Neurology

## 2021-09-09 DIAGNOSIS — K909 Intestinal malabsorption, unspecified: Secondary | ICD-10-CM | POA: Diagnosis not present

## 2021-09-09 DIAGNOSIS — R002 Palpitations: Secondary | ICD-10-CM

## 2021-09-09 DIAGNOSIS — G4733 Obstructive sleep apnea (adult) (pediatric): Secondary | ICD-10-CM

## 2021-09-09 DIAGNOSIS — R1013 Epigastric pain: Secondary | ICD-10-CM | POA: Diagnosis not present

## 2021-09-09 DIAGNOSIS — Z73819 Behavioral insomnia of childhood, unspecified type: Secondary | ICD-10-CM

## 2021-09-09 DIAGNOSIS — K648 Other hemorrhoids: Secondary | ICD-10-CM | POA: Diagnosis not present

## 2021-09-09 DIAGNOSIS — G4730 Sleep apnea, unspecified: Secondary | ICD-10-CM

## 2021-09-09 DIAGNOSIS — K5120 Ulcerative (chronic) proctitis without complications: Secondary | ICD-10-CM | POA: Diagnosis not present

## 2021-09-09 DIAGNOSIS — N951 Menopausal and female climacteric states: Secondary | ICD-10-CM

## 2021-09-13 DIAGNOSIS — K909 Intestinal malabsorption, unspecified: Secondary | ICD-10-CM | POA: Diagnosis not present

## 2021-09-13 DIAGNOSIS — K5120 Ulcerative (chronic) proctitis without complications: Secondary | ICD-10-CM | POA: Diagnosis not present

## 2021-09-14 ENCOUNTER — Ambulatory Visit: Payer: BC Managed Care – PPO | Admitting: Obstetrics and Gynecology

## 2021-09-14 ENCOUNTER — Telehealth: Payer: Self-pay | Admitting: *Deleted
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
## 2021-09-14 NOTE — Telephone Encounter (Signed)
-----   Message from Melvyn Novas, MD sent at 09/14/2021 12:07 PM EST ----- IMPRESSION: 0. Many spontaneous arousals without physiologic correlation were noted in the second half of the study- Very fragmented sleep after 2.15 AM - the first half of the night was less fragmented by far.  Was there pain or discomfort while sleeping supine after 2.15 AM?  1. Please review sleep architecture: see attached hypnography.    1. Mild Obstructive Sleep Apnea (OSA), no central events noted (!) 2. Mild to moderately loud bursts of snoring, not sustained. 3. This Apnea is REM sleep accentuated and clearly dependent on supine position.  4. Clinically irrelevant degree of Periodic Limb Movement Disorder (PLMD) 5. Hear rate was regular with bradycardia.  RECOMMENDATIONS:  1. Advise to avoid supine sleep - That sleep positional change alone can reduce apnea to a level where no further intervention is needed. Consider tennis ball or sleep balance device to achieve lasting behavioral changes.   2. Option 2: if positional changes are not possible, start auto- CPAP titration to optimize therapy. That would be with a mask of patient's choice and heated humidification, pressure window from 6- 15 cm water 2 cm EPR. 3. Sleep fragmentation could be related to supine sleep position, to pain or discomfort, or to the reported unwanted early morning arousals. I recommend trazodone trial to help extend sleep duration, 25-50 mg at night po.

## 2021-09-14 NOTE — Telephone Encounter (Signed)
LVM for pt to call about results. °

## 2021-09-14 NOTE — Telephone Encounter (Signed)
Took call from phone staff and spoke w/ pt. Relayed results per Dr. Oliva Bustard note. She feels she is able to avoid supine sleep. Typically only sleeps on left or right side. She wants to cx upcoming f/u for now and follow here prn. She has taken trazodone prn but sensitive to medication. She only takes sparingly. Aware she will need to follow up at least once a year if she wants continued refills from Dr. Vickey Huger. Pt states she will see if PCP will refill if she decides to continue this medication. Nothing further needed.

## 2021-09-14 NOTE — Progress Notes (Signed)
IMPRESSION: 0. Many spontaneous arousals without physiologic correlation were noted in the second half of the study- Very fragmented sleep after 2.15 AM - the first half of the night was less fragmented by far.  Was there pain or discomfort while sleeping supine after 2.15 AM?  1. Please review sleep architecture: see attached hypnography.    1. Mild Obstructive Sleep Apnea (OSA), no central events noted (!) 2. Mild to moderately loud bursts of snoring, not sustained. 3. This Apnea is REM sleep accentuated and clearly dependent on supine position.  4. Clinically irrelevant degree of Periodic Limb Movement Disorder (PLMD) 5. Hear rate was regular with bradycardia.  RECOMMENDATIONS:  1. Advise to avoid supine sleep - That sleep positional change alone can reduce apnea to a level where no further intervention is needed. Consider tennis ball or sleep balance device to achieve lasting behavioral changes.   2. Option 2: if positional changes are not possible, start auto- CPAP titration to optimize therapy. That would be with a mask of patient's choice and heated humidification, pressure window from 6- 15 cm water 2 cm EPR. 3. Sleep fragmentation could be related to supine sleep position, to pain or discomfort, or to the reported unwanted early morning arousals. I recommend trazodone trial to help extend sleep duration, 25-50 mg at night po.

## 2021-09-15 ENCOUNTER — Encounter (HOSPITAL_BASED_OUTPATIENT_CLINIC_OR_DEPARTMENT_OTHER): Payer: Self-pay

## 2021-10-13 NOTE — Progress Notes (Signed)
61 y.o. [id number] Divorced White or Caucasian Not Hispanic or Latino female here for annual exam.  No vaginal bleeding. Not sexually active. No bladder c/o. Feels better with the vaginal estrogen.  Patient was dx with EPI (pancreatic insufficiency) at [facility].   H/O ulcerative colitis in remission. Recent issues are thought to be from EPI. She has been very gassy, greasy stools, fecal urgency, some fecal soiling. Getting a little better with use of a probiotic.     No LMP recorded. Patient has had a hysterectomy.          Sexually active: No.  The current method of family planning is status post hysterectomy.    Exercising: Yes.     Walking, biking, yoga, [place]  Smoker:  no  Health Maintenance: Pap:  unsure , no abnormal   History of abnormal Pap:  no MMG:  06/17/20 Bi-rads 1 neg  BMD:   years ago  Colonoscopy: 2019 normal  TDaP:  utd per patient  Gardasil: n/a   reports that she quit smoking about 15 years ago. Her smoking use included cigarettes. She has never used smokeless tobacco. She reports that she does not drink alcohol and does not use drugs. Works in [employer], [employer]. Kids are grown, 7 grandchildren (7-16), local.   Past Medical History:  Diagnosis Date   Anxiety    Anxiety disorder    Bipolar disorder ([place])    Depression    Diverticulosis    Gastritis    Helicobacter pylori gastritis 12/2006   IBS (irritable bowel syndrome)    Pericarditis    S/P dilatation of esophageal stricture 2008   UC (ulcerative colitis) ([place])    Left sided     Past Surgical History:  Procedure Laterality Date   ABDOMINAL HYSTERECTOMY     COLONOSCOPY     DILATION AND CURETTAGE OF UTERUS     ESOPHAGOGASTRODUODENOSCOPY     HERNIA REPAIR     TUBAL LIGATION     Tummy Tuck      Current Outpatient Medications  Medication Sig Dispense Refill   diphenhydrAMINE HCl, Sleep, (SIMPLY SLEEP PO) Take 1 capsule by mouth at bedtime as needed (sleep).     VAGIFEM 10 MCG TABS vaginal tablet INSERT 1  TABLET(10 MCG) VAGINALLY 2 TIMES A WEEK (Patient taking differently: Place 10 mcg vaginally See admin instructions. Thursday and sunday) 24 tablet 3   No current facility-administered medications for this visit.    Family History  Problem Relation Age of Onset   Hypertension Mother    Diabetes Mother    Heart disease Mother    Irritable bowel syndrome Mother    Alzheimer's disease Mother    Melanoma Father    Heart attack Brother 36   Breast cancer Paternal Aunt    Hypertension Maternal Grandmother    Heart attack Paternal Grandmother    Hypertension Paternal Grandmother     Review of Systems  All other systems reviewed and are negative.  Exam:   BP 130/62    Pulse 62    Ht 5\' 6"  (1.676 m)    Wt 134 lb (60.8 kg)    SpO2 99%    BMI 21.63 kg/m   Weight change: @WEIGHTCHANGE @ Height:   Height: 5\' 6"  (167.6 cm)  Ht Readings from Last 3 Encounters:  10/15/21 5\' 6"  (1.676 m)  08/26/21 5\' 6"  (1.676 m)  08/13/21 5\' 6"  (1.676 m)    General appearance: alert, cooperative and appears stated age Head: Normocephalic, without obvious abnormality,  atraumatic Neck: no adenopathy, supple, symmetrical, trachea midline and thyroid normal to inspection and palpation Lungs: clear to auscultation bilaterally Cardiovascular: regular rate and rhythm Breasts: normal appearance, no masses or tenderness Abdomen: soft, non-tender; non distended,  no masses,  no organomegaly Extremities: extremities normal, atraumatic, no cyanosis or edema Skin: Skin color, texture, turgor normal. No rashes or lesions Lymph nodes: Cervical, supraclavicular, and axillary nodes normal. No abnormal inguinal nodes palpated Neurologic: Grossly normal   Pelvic: External genitalia:  no lesions              Urethra:  normal appearing urethra with no masses, tenderness or lesions              Bartholins and Skenes: normal                 Vagina: normal appearing vagina with normal color and discharge, no lesions               Cervix: absent               Bimanual Exam:  Uterus:  uterus absent              Adnexa: no mass, fullness, tenderness               Rectovaginal: Confirms               Anus:  normal sphincter tone, no lesions  Santiago Glad, CMA chaperoned for the exam.   1. Well woman exam No pap needed Mammogram due, she will schedule Colonoscopy UTD Discussed breast self exam Discussed calcium and vit D intake  2. Vaginal atrophy Doing well with vaginal estrogen. - Estradiol (VAGIFEM) 10 MCG TABS vaginal tablet; Place 1 tablet (10 mcg total) vaginally 2 (two) times a week. INSERT 1 TABLET(10 MCG) VAGINALLY 2 TIMES A WEEK Strength: 10 mcg  Dispense: 24 tablet; Refill: 3

## 2021-10-15 ENCOUNTER — Ambulatory Visit (INDEPENDENT_AMBULATORY_CARE_PROVIDER_SITE_OTHER): Payer: BC Managed Care – PPO | Admitting: Obstetrics and Gynecology

## 2021-10-15 ENCOUNTER — Other Ambulatory Visit: Payer: Self-pay

## 2021-10-15 ENCOUNTER — Encounter: Payer: Self-pay | Admitting: Obstetrics and Gynecology

## 2021-10-15 VITALS — BP 130/62 | HR 62 | Ht 66.0 in | Wt 134.0 lb

## 2021-10-15 DIAGNOSIS — Z01419 Encounter for gynecological examination (general) (routine) without abnormal findings: Secondary | ICD-10-CM | POA: Diagnosis not present

## 2021-10-15 DIAGNOSIS — N952 Postmenopausal atrophic vaginitis: Secondary | ICD-10-CM

## 2021-10-15 MED ORDER — ESTRADIOL 10 MCG VA TABS: 10.0000 ug | ORAL_TABLET | VAGINAL | 3 refills | Status: DC

## 2021-10-15 NOTE — Addendum Note (Signed)
Addended by: Tobi Bastos on: 10/15/2021 12:37 PM   Modules accepted: Orders

## 2021-10-15 NOTE — Patient Instructions (Signed)

## 2021-10-18 ENCOUNTER — Other Ambulatory Visit: Payer: Self-pay | Admitting: Neurology

## 2021-10-21 ENCOUNTER — Institutional Professional Consult (permissible substitution): Payer: BC Managed Care – PPO | Admitting: Neurology

## 2021-11-16 ENCOUNTER — Ambulatory Visit: Payer: BC Managed Care – PPO | Admitting: Neurology

## 2022-01-03 ENCOUNTER — Telehealth: Payer: Self-pay | Admitting: *Deleted

## 2022-01-03 NOTE — Telephone Encounter (Signed)
PA done via cover my meds for estradiol vaginal 10 mcg tablet. Pending response from insurance.  ?

## 2022-01-06 NOTE — Telephone Encounter (Signed)
[employer] denied Rx for estradiol 10 mcg tablets. Patient will need to try/fail three alternative drugs on formulary such as Intrarosa, Estring, Premarin cream and estradiol 0.01%.  ? ?I called patient to relay and patient reports she has new insurance and has not took her insurance to the pharmacy yet. She asked me to disregard this. I told her if anything needs to be done with new insurance the pharmacy will let me know. ?

## 2022-01-17 NOTE — Telephone Encounter (Signed)
Dr.Lavoie please see the below note.  Patient generic vagifem was denied patient will need to try the below medication. I spoke with her and she is willing to try the estradiol vaginal cream 0.1% 1 gram. Will directions be twice weekly? ?

## 2022-01-18 MED ORDER — ESTRADIOL 0.1 MG/GM VA CREA: TOPICAL_CREAM | VAGINAL | 3 refills | Status: DC

## 2022-01-18 NOTE — Addendum Note (Signed)
Addended by: Thamas Jaegers on: 01/18/2022 09:08 AM ? ? Modules accepted: Orders ? ?

## 2022-01-18 NOTE — Telephone Encounter (Signed)
Dr.Lavoie replied "Yes estradiol vaginal cream 0.1% 1 gram twice a week. " ? ?Rx sent.  ?

## 2022-02-14 ENCOUNTER — Other Ambulatory Visit: Payer: Self-pay | Admitting: Obstetrics and Gynecology

## 2022-02-14 DIAGNOSIS — Z1231 Encounter for screening mammogram for malignant neoplasm of breast: Secondary | ICD-10-CM

## 2022-02-15 DIAGNOSIS — Z8379 Family history of other diseases of the digestive system: Secondary | ICD-10-CM | POA: Diagnosis not present

## 2022-02-15 DIAGNOSIS — K5120 Ulcerative (chronic) proctitis without complications: Secondary | ICD-10-CM | POA: Diagnosis not present

## 2022-02-15 DIAGNOSIS — R110 Nausea: Secondary | ICD-10-CM | POA: Diagnosis not present

## 2022-02-15 DIAGNOSIS — R1013 Epigastric pain: Secondary | ICD-10-CM | POA: Diagnosis not present

## 2022-02-23 ENCOUNTER — Ambulatory Visit
Admission: RE | Admit: 2022-02-23 | Discharge: 2022-02-23 | Disposition: A | Discharge status: Home / Self Care | Payer: BC Managed Care – PPO | Source: Ambulatory Visit | Attending: Obstetrics and Gynecology | Admitting: Obstetrics and Gynecology

## 2022-02-23 DIAGNOSIS — Z1231 Encounter for screening mammogram for malignant neoplasm of breast: Secondary | ICD-10-CM | POA: Diagnosis not present

## 2022-02-23 DIAGNOSIS — R1013 Epigastric pain: Secondary | ICD-10-CM | POA: Diagnosis not present

## 2022-02-23 DIAGNOSIS — R112 Nausea with vomiting, unspecified: Secondary | ICD-10-CM | POA: Diagnosis not present

## 2022-03-07 DIAGNOSIS — K2970 Gastritis, unspecified, without bleeding: Secondary | ICD-10-CM | POA: Diagnosis not present

## 2022-03-07 DIAGNOSIS — K209 Esophagitis, unspecified without bleeding: Secondary | ICD-10-CM | POA: Diagnosis not present

## 2022-03-07 DIAGNOSIS — K2950 Unspecified chronic gastritis without bleeding: Secondary | ICD-10-CM | POA: Diagnosis not present

## 2022-03-07 DIAGNOSIS — K2980 Duodenitis without bleeding: Secondary | ICD-10-CM | POA: Diagnosis not present

## 2022-03-07 DIAGNOSIS — K5120 Ulcerative (chronic) proctitis without complications: Secondary | ICD-10-CM | POA: Diagnosis not present

## 2022-03-07 DIAGNOSIS — K648 Other hemorrhoids: Secondary | ICD-10-CM | POA: Diagnosis not present

## 2022-03-07 DIAGNOSIS — K5730 Diverticulosis of large intestine without perforation or abscess without bleeding: Secondary | ICD-10-CM | POA: Diagnosis not present

## 2022-03-07 DIAGNOSIS — K649 Unspecified hemorrhoids: Secondary | ICD-10-CM | POA: Diagnosis not present

## 2022-03-07 DIAGNOSIS — K5289 Other specified noninfective gastroenteritis and colitis: Secondary | ICD-10-CM | POA: Diagnosis not present

## 2022-03-07 DIAGNOSIS — K208 Other esophagitis without bleeding: Secondary | ICD-10-CM | POA: Diagnosis not present

## 2022-03-07 DIAGNOSIS — K2960 Other gastritis without bleeding: Secondary | ICD-10-CM | POA: Diagnosis not present

## 2022-03-07 DIAGNOSIS — R1013 Epigastric pain: Secondary | ICD-10-CM | POA: Diagnosis not present

## 2022-03-07 DIAGNOSIS — K644 Residual hemorrhoidal skin tags: Secondary | ICD-10-CM | POA: Diagnosis not present

## 2022-04-15 DIAGNOSIS — K5190 Ulcerative colitis, unspecified, without complications: Secondary | ICD-10-CM | POA: Insufficient documentation

## 2022-04-18 ENCOUNTER — Telehealth: Payer: Self-pay | Admitting: Cardiovascular Disease

## 2022-04-18 NOTE — Telephone Encounter (Signed)
RN returned call to patient, patient states she was having some tachycardia over the weekend. Patient is in [place] for 5-6 weeks. Heart rates below with associated symptoms, has been going on since April/may. Patient does endorse an active ulcerative colitis flare. Patient is overdue for follow up, scheduled with Dr. Duke Salvia 8/15. RN gave instructions to call if symptoms worsen in the meantime or if shortness of breath or chest pain occur to report to ED.       STAT if HR is under 50 or over 120 (normal HR is 60-100 beats per minute)   What is your heart rate? 73   Do you have a log of your heart rate readings (document readings)?  180 - Yesterday 115 141   Do you have any other symptoms? Tired, fatigue, palpitations

## 2022-04-18 NOTE — Telephone Encounter (Signed)
STAT if HR is under 50 or over 120 (normal HR is 60-100 beats per minute)  What is your heart rate? 73  Do you have a log of your heart rate readings (document readings)?  180 - Yesterday 115 141  Do you have any other symptoms? Tired, fatigue, palpitations

## 2022-05-10 ENCOUNTER — Ambulatory Visit (HOSPITAL_BASED_OUTPATIENT_CLINIC_OR_DEPARTMENT_OTHER): Payer: BC Managed Care – PPO | Admitting: Cardiovascular Disease

## 2022-05-10 ENCOUNTER — Encounter (HOSPITAL_BASED_OUTPATIENT_CLINIC_OR_DEPARTMENT_OTHER): Payer: Self-pay | Admitting: Cardiovascular Disease

## 2022-05-10 VITALS — BP 110/70 | HR 66 | Ht 66.0 in | Wt 133.0 lb

## 2022-05-10 DIAGNOSIS — Z79899 Other long term (current) drug therapy: Secondary | ICD-10-CM | POA: Diagnosis not present

## 2022-05-10 DIAGNOSIS — R072 Precordial pain: Secondary | ICD-10-CM | POA: Diagnosis not present

## 2022-05-10 DIAGNOSIS — R002 Palpitations: Secondary | ICD-10-CM

## 2022-05-10 MED ORDER — METOPROLOL TARTRATE 25 MG PO TABS: 25.0000 mg | ORAL_TABLET | Freq: Once | ORAL | 0 refills | Status: DC
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
## 2022-05-10 NOTE — Patient Instructions (Addendum)
Medication Instructions:  Take- Metoprolol Tartrate 25 mg by mouth 2 hour prior to procedure  *If you need a refill on your cardiac medications before your next appointment, please call your pharmacy*   Lab Work: BMP  If you have labs (blood work) drawn today and your tests are completely normal, you will receive your results only by: MyChart Message (if you have MyChart) OR A paper copy in the mail If you have any lab test that is abnormal or we need to change your treatment, we will call you to review the results.   Testing/Procedures: Non-Cardiac CT Angiography (CTA), is a special type of CT scan that uses a computer to produce multi-dimensional views of major blood vessels throughout the body. In CT angiography, a contrast material is injected through an IV to help visualize the blood vessels  Your physician has recommended that you wear an event monitor for 30 days. Event monitors are medical devices that record the heart's electrical activity. Doctors most often [place] these monitors to diagnose arrhythmias. Arrhythmias are problems with the speed or rhythm of the heartbeat. The monitor is a small, portable device. You can wear one while you do your normal daily activities. This is usually used to diagnose what is causing palpitations/syncope (passing out).   Follow-Up: At [facility], you and your health needs are our priority.  As part of our continuing mission to provide you with exceptional heart care, we have created designated Provider Care Teams.  These Care Teams include your primary Cardiologist (physician) and Advanced Practice Providers (APPs -  Physician Assistants and Nurse Practitioners) who all work together to provide you with the care you need, when you need it.  We recommend signing up for the patient portal called "MyChart".  Sign up information is provided on this After Visit Summary.  MyChart is used to connect with patients for Virtual Visits (Telemedicine).   Patients are able to view lab/test results, encounter notes, upcoming appointments, etc.  Non-urgent messages can be sent to your provider as well.   To learn more about what you can do with MyChart, go to [email or web address].    Your next appointment:   3 month(s)  The format for your next appointment:   In Person  Provider:   Gillian Shields, NP    Other Instructions   Your cardiac CT will be scheduled at one of the below locations:   [facility] [address] [place], [place] [address] [phone]   If scheduled at [facility], please arrive at the [facility] and Children's Entrance (Entrance C2) of [facility] 30 minutes prior to test start time. You can use the FREE valet parking offered at entrance C (encouraged to control the heart rate for the test)  Proceed to the [facility] Radiology Department (first floor) to check-in and test prep.  All radiology patients and guests should use entrance C2 at [facility], accessed from [facility], even though the hospital's physical address listed is [address]    If scheduled at [facility], please arrive 15 mins early for check-in and test prep.  Please follow these instructions carefully (unless otherwise directed):   On the Night Before the Test: Be sure to Drink plenty of water. Do not consume any caffeinated/decaffeinated beverages or chocolate 12 hours prior to your test. Do not take any antihistamines 12 hours prior to your test.   On the Day of the Test: Drink plenty of  water until 1 hour prior to the test. Do not eat any food 4 hours prior to the test. You may take your regular medications prior to the test.  Take metoprolol (Lopressor) two hours prior to test. HOLD Furosemide/Hydrochlorothiazide morning of the test. FEMALES- please wear underwire-free bra if available, avoid dresses & tight clothing       After the  Test: Drink plenty of water. After receiving IV contrast, you may experience a mild flushed feeling. This is normal. On occasion, you may experience a mild rash up to 24 hours after the test. This is not dangerous. If this occurs, you can take Benadryl 25 mg and increase your fluid intake. If you experience trouble breathing, this can be serious. If it is severe call 911 IMMEDIATELY. If it is mild, please call our office. If you take any of these medications: Glipizide/Metformin, Avandament, Glucavance, please do not take 48 hours after completing test unless otherwise instructed.  We will call to schedule your test 2-4 weeks out understanding that some insurance companies will need an authorization prior to the service being performed.   For non-scheduling related questions, please contact the cardiac imaging nurse navigator should you have any questions/concerns: Rockwell Alexandria, Cardiac Imaging Nurse Navigator Larey Brick, Cardiac Imaging Nurse Navigator [facility] Direct Office Dial: [phone]   For scheduling needs, including cancellations and rescheduling, please call [place], [phone].   [employer] Cardiac Event Monitor Instructions Your physician has requested you wear your cardiac event monitor for 30 days, (1-30). [employer] may call or text to confirm a shipping address. The monitor will be sent to a land address via UPS. [employer] will not ship a monitor to a PO BOX. It typically takes 3-5 days to receive your monitor after it has been enrolled. [employer] will assist with USPS tracking if your package is delayed. The telephone number for [employer] is [phone]. Once you have received your monitor, please review the enclosed instructions. Instruction tutorials can also be viewed under help and settings on the enclosed cell phone. Your monitor has already been registered assigning a specific monitor serial # to you.  Applying the  monitor Remove cell phone from case and turn it on. The cell phone works as IT consultant and needs to be within [employer] of you at all times. The cell phone will need to be charged on a daily basis. We recommend you plug the cell phone into the enclosed charger at your bedside table every night.  Monitor batteries: You will receive two monitor batteries labelled #1 and #2. These are your recorders. Plug battery #2 onto the second connection on the enclosed charger. Keep one battery on the charger at all times. This will keep the monitor battery deactivated. It will also keep it fully charged for when you need to switch your monitor batteries. A small light will be blinking on the battery emblem when it is charging. The light on the battery emblem will remain on when the battery is fully charged.  Open package of a Monitor strip. Insert battery #1 into black hood on strip and gently squeeze monitor battery onto connection as indicated in instruction booklet. Set aside while preparing skin.  Choose location for your strip, vertical or horizontal, as indicated in the instruction booklet. Shave to remove all hair from location. There cannot be any lotions, oils, powders, or colognes on skin where monitor is to be applied. Wipe skin clean with enclosed Saline wipe. Dry skin completely.  Peel  paper labeled #1 off the back of the Monitor strip exposing the adhesive. Place the monitor on the chest in the vertical or horizontal position shown in the instruction booklet. One arrow on the monitor strip must be pointing upward. Carefully remove paper labeled #2, attaching remainder of strip to your skin. Try not to create any folds or wrinkles in the strip as you apply it.  Firmly press and release the circle in the center of the monitor battery. You will hear a small beep. This is turning the monitor battery on. The heart emblem on the monitor battery will light up every 5 seconds if the monitor  battery in turned on and connected to the patient securely. Do not push and hold the circle down as this turns the monitor battery off. The cell phone will locate the monitor battery. A screen will appear on the cell phone checking the connection of your monitor strip. This may read poor connection initially but change to good connection within the next minute. Once your monitor accepts the connection you will hear a series of 3 beeps followed by a climbing crescendo of beeps. A screen will appear on the cell phone showing the two monitor strip placement options. Touch the picture that demonstrates where you applied the monitor strip.  Your monitor strip and battery are waterproof. You are able to shower, bathe, or swim with the monitor on. They just ask you do not submerge deeper than 3 feet underwater. We recommend removing the monitor if you are swimming in a lake, river, or ocean.  Your monitor battery will need to be switched to a fully charged monitor battery approximately once a week. The cell phone will alert you of an action which needs to be made.  On the cell phone, tap for details to reveal connection status, monitor battery status, and cell phone battery status. The green dots indicates your monitor is in good status. A red dot indicates there is something that needs your attention.  To record a symptom, click the circle on the monitor battery. In 30-60 seconds a list of symptoms will appear on the cell phone. Select your symptom and tap save. Your monitor will record a sustained or significant arrhythmia regardless of you clicking the button. Some patients do not feel the heart rhythm irregularities. [employer] will notify us of any serious or critical events.  Refer to instruction booklet for instructions on switching batteries, changing strips, the Do not disturb or Pause features, or any additional questions.  Call [employer] at [phone], to confirm your monitor is  transmitting and record your baseline. They will answer any questions you may have regarding the monitor instructions at that time.  Returning the monitor to [employer] Place all equipment back into blue box. Peel off strip of paper to expose adhesive and close box securely. There is a prepaid UPS shipping label on this box. Drop in a UPS drop box, or at a UPS facility like [employer]. You may also contact [employer] to arrange UPS to pick up monitor package at your home.

## 2022-05-10 NOTE — Assessment & Plan Note (Signed)
She has struggled with some atypical chest pain and exertional symptoms.  She has a history of pericarditis.  We will get a coronary CTA to assess both her coronaries and her pericardium.  Given that she had ulcerative colitis flares it is unclear how much checking an ESR would be helpful for differentiate between UC and pericarditis.  No evidence of pericarditis on her EKG.

## 2022-05-10 NOTE — Assessment & Plan Note (Signed)
She had episodes of palpitations and tachycardia up to the 180s.  This event sometimes occurs at rest and last for several minutes at a time.  We will have her wear a 30-day monitor to better assess.

## 2022-05-11 ENCOUNTER — Other Ambulatory Visit (HOSPITAL_BASED_OUTPATIENT_CLINIC_OR_DEPARTMENT_OTHER): Payer: Self-pay | Admitting: Cardiovascular Disease

## 2022-05-17 DIAGNOSIS — Z79899 Other long term (current) drug therapy: Secondary | ICD-10-CM | POA: Diagnosis not present

## 2022-05-17 DIAGNOSIS — R002 Palpitations: Secondary | ICD-10-CM | POA: Diagnosis not present

## 2022-05-17 DIAGNOSIS — R072 Precordial pain: Secondary | ICD-10-CM | POA: Diagnosis not present

## 2022-05-18 LAB — BASIC METABOLIC PANEL
BUN/Creatinine Ratio: 22 (ref 12–28)
BUN: 20 mg/dL (ref 8–27)
CO2: 23 mmol/L (ref 20–29)
Calcium: 9.2 mg/dL (ref 8.7–10.3)
Chloride: 106 mmol/L (ref 96–106)
Creatinine, Ser: 0.91 mg/dL (ref 0.57–1.00)
Glucose: 142 mg/dL — ABNORMAL HIGH (ref 70–99)
Potassium: 4.5 mmol/L (ref 3.5–5.2)
Sodium: 144 mmol/L (ref 134–144)
eGFR: 71 mL/min/{1.73_m2} (ref >59)

## 2022-05-20 ENCOUNTER — Telehealth: Payer: Self-pay | Admitting: Cardiovascular Disease

## 2022-05-20 DIAGNOSIS — R002 Palpitations: Secondary | ICD-10-CM

## 2022-05-20 NOTE — Telephone Encounter (Signed)
  Pt is following up her hart monitor. She said she called [employer] and was told they did not receive request for heart monitor for her

## 2022-05-20 NOTE — Telephone Encounter (Signed)
Hi Shelly! Can you look into this monitor for me please? Thank you!  Alver Sorrow, NP

## 2022-05-20 NOTE — Telephone Encounter (Signed)
Updated order put in. Let me know if it didn't come through correctly!  Thanks,  Alver Sorrow, NP

## 2022-05-23 NOTE — Telephone Encounter (Signed)
Monitor team received and mailed monitor.   Alver Sorrow, NP

## 2022-05-27 ENCOUNTER — Telehealth (HOSPITAL_COMMUNITY): Payer: Self-pay | Admitting: *Deleted

## 2022-05-27 NOTE — Telephone Encounter (Signed)
Reaching out to patient to offer assistance regarding upcoming cardiac imaging study; pt verbalizes understanding of appt date/time, parking situation and where to check in, pre-test NPO status and medications ordered, and verified current allergies; name and call back number provided for further questions should they arise  Anaaya Fuster RN Navigator Cardiac Imaging [facility] [phone] office [phone] cell  Patient to take 25mg metoprolol tartrate two hours prior to her cardiac CT scan. She is aware to arrive at 3:30pm. 

## 2022-05-31 ENCOUNTER — Ambulatory Visit (HOSPITAL_COMMUNITY)
Admission: RE | Admit: 2022-05-31 | Discharge: 2022-05-31 | Disposition: A | Discharge status: Home / Self Care | Payer: BC Managed Care – PPO | Source: Ambulatory Visit | Attending: Cardiovascular Disease | Admitting: Cardiovascular Disease

## 2022-05-31 DIAGNOSIS — R072 Precordial pain: Secondary | ICD-10-CM | POA: Diagnosis not present

## 2022-05-31 MED ORDER — NITROGLYCERIN 0.4 MG SL SUBL
0.8000 mg | SUBLINGUAL_TABLET | Freq: Once | SUBLINGUAL | Status: AC
  Administered 2022-05-31: 0.8 mg via SUBLINGUAL

## 2022-05-31 MED ORDER — IOHEXOL 350 MG/ML SOLN
100.0000 mL | Freq: Once | INTRAVENOUS | Status: AC | PRN
  Administered 2022-05-31: 100 mL via INTRAVENOUS

## 2022-05-31 MED ORDER — NITROGLYCERIN 0.4 MG SL SUBL
SUBLINGUAL_TABLET | SUBLINGUAL | Status: AC
  Filled 2022-05-31: qty 2

## 2022-06-01 ENCOUNTER — Ambulatory Visit: Payer: BC Managed Care – PPO | Attending: Cardiovascular Disease

## 2022-06-01 DIAGNOSIS — R002 Palpitations: Secondary | ICD-10-CM | POA: Diagnosis not present

## 2022-06-02 ENCOUNTER — Telehealth (HOSPITAL_BASED_OUTPATIENT_CLINIC_OR_DEPARTMENT_OTHER): Payer: Self-pay | Admitting: Cardiovascular Disease

## 2022-06-02 NOTE — Telephone Encounter (Signed)
Please advise 

## 2022-06-02 NOTE — Telephone Encounter (Signed)
Patient called stating her Cardiac CT looked OK and she would like to know if she should still to the heart monitor study.

## 2022-06-03 ENCOUNTER — Telehealth: Payer: Self-pay | Admitting: Internal Medicine

## 2022-06-03 NOTE — Telephone Encounter (Signed)
Hi Dr. Rhea Belton,  Patient called requesting a transfer of care over to you. She has GI history with [facility] states the provider has retired. Her records are available via Epic though Care Everywhere for you to review and advise on scheduling.     Thanks

## 2022-06-05 NOTE — Telephone Encounter (Signed)
ok 

## 2022-06-10 ENCOUNTER — Encounter: Payer: Self-pay | Admitting: Internal Medicine

## 2022-06-10 NOTE — Telephone Encounter (Signed)
Patient has been scheduled for appointment with Dr. Rhea Belton on 10/6 at 9:50

## 2022-06-10 NOTE — Telephone Encounter (Signed)
As previously stated, she is ok for an appt JMP

## 2022-06-10 NOTE — Telephone Encounter (Signed)
Patient called to follow on transfer of care . Please advise on scheduling

## 2022-06-10 NOTE — Telephone Encounter (Signed)
Left voice mail for patient to schedule.

## 2022-06-14 ENCOUNTER — Telehealth (HOSPITAL_BASED_OUTPATIENT_CLINIC_OR_DEPARTMENT_OTHER): Payer: Self-pay | Admitting: *Deleted

## 2022-06-14 DIAGNOSIS — Z13220 Encounter for screening for lipoid disorders: Secondary | ICD-10-CM

## 2022-06-14 NOTE — Telephone Encounter (Signed)
Advised patient of lab results  Has active colitis with daily bleeding Had recent colonoscopy, meds not helping Seeing new GI in October Concerned about taking ASA   Will forward to [place] Mam NP and Dr Oval Linsey for review

## 2022-06-14 NOTE — Telephone Encounter (Signed)
-----   Message from Loel Dubonnet, NP sent at 06/13/2022 10:23 PM EDT ----- Cardiac CT with coronary calcium score of 11. There is minimal nonobstructive plaque in one artery. Not significant enough to cause symptoms of chest pain or dyspnea.   Recommend secondary prevention with Aspirin EC 81 mg daily. Recommend LDL (bad cholesterol) <70. Last lipid panel in [facility] 2021 - recommend FLP/LFT in next 2 weeks to assess.

## 2022-06-14 NOTE — Telephone Encounter (Signed)
If she wants to wait to take Aspirin until discussing with her new GI provider in October to get their clearance that is more than reasonable.   Loel Dubonnet, NP

## 2022-06-15 NOTE — Telephone Encounter (Signed)
Recommendations released via mychart

## 2022-06-20 ENCOUNTER — Encounter: Payer: Self-pay | Admitting: *Deleted

## 2022-07-01 ENCOUNTER — Other Ambulatory Visit: Payer: BC Managed Care – PPO

## 2022-07-01 ENCOUNTER — Encounter: Payer: Self-pay | Admitting: Internal Medicine

## 2022-07-01 ENCOUNTER — Telehealth: Payer: Self-pay | Admitting: Internal Medicine

## 2022-07-01 ENCOUNTER — Ambulatory Visit: Payer: BC Managed Care – PPO | Admitting: Internal Medicine

## 2022-07-01 VITALS — BP 100/60 | HR 70 | Ht 66.0 in | Wt 132.0 lb

## 2022-07-01 DIAGNOSIS — I774 Celiac artery compression syndrome: Secondary | ICD-10-CM

## 2022-07-01 DIAGNOSIS — K5130 Ulcerative (chronic) rectosigmoiditis without complications: Secondary | ICD-10-CM

## 2022-07-01 DIAGNOSIS — K8681 Exocrine pancreatic insufficiency: Secondary | ICD-10-CM | POA: Diagnosis not present

## 2022-07-01 DIAGNOSIS — K2100 Gastro-esophageal reflux disease with esophagitis, without bleeding: Secondary | ICD-10-CM | POA: Diagnosis not present

## 2022-07-01 MED ORDER — MESALAMINE 1.2 G PO TBEC: 2.4000 g | DELAYED_RELEASE_TABLET | Freq: Every day | ORAL | 5 refills | Status: DC

## 2022-07-01 MED ORDER — UCERIS 2 MG/ACT RE FOAM: 1.0000 | Freq: Every evening | RECTAL | 0 refills | Status: DC

## 2022-07-01 MED ORDER — FAMOTIDINE 20 MG PO TABS: 20.0000 mg | ORAL_TABLET | Freq: Two times a day (BID) | ORAL | 5 refills | Status: DC

## 2022-07-01 NOTE — Telephone Encounter (Signed)
Spoke to pharmacist. Each can of budesonide rectal foam has enough metered doses for 1 month supply at one attenuation each night. Gave updated rx for budesonide rectal foam 1 attenuation nightly.

## 2022-07-01 NOTE — Addendum Note (Signed)
Addended by: Jerene Bears on: 07/01/2022 12:38 PM   Modules accepted: Level of Service

## 2022-07-01 NOTE — Patient Instructions (Addendum)
Discontinue Canasa.  We have sent the following medications to your pharmacy for you to pick up at your convenience: Uceris rectal foam Lialda 2.4 grams daily Famotidine 20 mg twice daily  Your provider has requested that you go to the basement level for lab work before leaving today. Press "B" on the elevator. The lab is located at the first door on the left as you exit the elevator.  Please return the fecal calprotectin test BEFORE starting your new medications.  Please follow up with Dr Hilarie Fredrickson in January 2024.  _______________________________________________________  If you are age 93 or older, your body mass index should be between 23-30. Your Body mass index is 21.31 kg/m. If this is out of the aforementioned range listed, please consider follow up with your Primary Care Provider.  If you are age 45 or younger, your body mass index should be between 19-25. Your Body mass index is 21.31 kg/m. If this is out of the aformentioned range listed, please consider follow up with your Primary Care Provider.   ________________________________________________________  The [facility] providers would like to encourage you to use [facility] to communicate with providers for non-urgent requests or questions.  Due to long hold times on the telephone, sending your provider a message by [facility] may be a faster and more efficient way to get a response.  Please allow 48 business hours for a response.  Please remember that this is for non-urgent requests.  _______________________________________________________  Due to recent changes in healthcare laws, you may see the results of your imaging and laboratory studies on MyChart before your provider has had a chance to review them.  We understand that in some cases there may be results that are confusing or concerning to you. Not all laboratory results come back in the same time frame and the provider may be waiting for multiple results in order to interpret  others.  Please give [place] 48 hours in order for your provider to thoroughly review all the results before contacting the office for clarification of your results.

## 2022-07-01 NOTE — Telephone Encounter (Signed)
[facility] pharmacy called states they need more information on patient Budesonide 2 mg. Please call to advise.

## 2022-07-01 NOTE — Progress Notes (Addendum)
Patient ID: Valerie Reed, female   DOB: [birth year], 61 y.o.   MRN: [id number] HPI: Valerie Reed is a 61 year old female with past medical history of ulcerative proctitis/proctosigmoiditis, GERD with esophagitis, previous treated H. Pylori, possibel EPI (low elastase), history of anxiety and depression who is seen in consult at the request of Dr. Darron Doom to establish care for her IBD.  She is here alone today.  Her most recent GI care has been with Dr. Earlean Shawl prior to his retirement and then earlier this year with Dr. Derrek Monaco in the [facility] system.  She had a colonoscopy on 03/07/2022 -- Inflammation from rectum to 20 cm which was moderate in nature.  Biopsies.  Diverticulosis in the sigmoid.  Remainder of the colon appeared normal.  Internal and external hemorrhoids were seen. -- Pathology chronic active IBD  EGD 03/07/2022  --LA grade B esophagitis, moderate erythematous mucosa of the gastric body and antrum.  Mildly erythematous duodenum. 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 pylori by IHC.  She reports that she continues to struggle with daily blood with bowel movements.  She typically has 3 bowel movements a day.  These are rarely formed and often loose.  No rectal pain.  Almost every bowel movement contains blood and mucus.  Tenesmus was present previously but is better.  She has been on Canasa 1 g nightly for 4 months.  She is a bit frustrated because of no improvement.  It sounds like Dr. Derrek Monaco prescribed oral mesalamine but because of the pill burden she did not take this.  It also sounds like in the past she benefited from oral Uceris.  She tries to eat a low acid diet but does deal with intermittent heartburn/pyrosis.  Some intermittent epigastric pain.  Previously in the year she had upper abdominal pain across the upper abdomen but this has improved.  She is not taking any regular acid  suppression therapy and is concerned about long-term PPI risks and therefore she does not take a PPI.  No known family history of IBD.  She has 3 adult children.  She works in Programmer, applications.  No alcohol.   Past Medical History:  Diagnosis Date   Anxiety    Anxiety disorder    Bipolar disorder ([place])    Depression    Diverticulosis    Gastritis    GERD (gastroesophageal reflux disease)    Helicobacter pylori gastritis 12/2006   Hemorrhoids    Hepatic cyst    IBS (irritable bowel syndrome)    Pericarditis    S/P dilatation of esophageal stricture 2008   UC (ulcerative colitis) ([place])    Left sided     Past Surgical History:  Procedure Laterality Date   ABDOMINAL HYSTERECTOMY     COLONOSCOPY     DILATION AND CURETTAGE OF UTERUS     ESOPHAGOGASTRODUODENOSCOPY     HERNIA REPAIR     inguineal in the 70's   TUBAL LIGATION     Tummy Tuck      Outpatient Medications Prior to Visit  Medication Sig Dispense Refill   diphenhydrAMINE HCl, Sleep, (SIMPLY SLEEP PO) Take 1 capsule by mouth at bedtime as needed (sleep).     estradiol (ESTRACE VAGINAL) 0.1 MG/GM vaginal cream Insert 1 gram vaginally twice weekly 42.5 g 3   mesalamine (CANASA) 1000 MG suppository Place 1,000 mg rectally at bedtime.     metoprolol tartrate (LOPRESSOR) 25 MG tablet Take  1 tablet (25 mg total) by mouth once for 1 dose. 2 hour prior to CTA 1 tablet 0   No facility-administered medications prior to visit.    Allergies  Allergen Reactions   Dairy Aid [Tilactase] Other (See Comments)    Family History  Problem Relation Age of Onset   Hypertension Mother    Diabetes Mother    Heart disease Mother    Irritable bowel syndrome Mother    Alzheimer's disease Mother    Melanoma Father    Heart attack Brother 74   Hypertension Maternal Grandmother    Heart attack Paternal Grandmother    Hypertension Paternal Grandmother    Breast cancer Paternal Aunt     Social History   Tobacco Use   Smoking status:  Former    Types: Cigarettes    Quit date: 12/30/2005    Years since quitting: 16.5   Smokeless tobacco: Never  Vaping Use   Vaping Use: Never used  Substance Use Topics   Alcohol use: No   Drug use: No    ROS: As per history of present illness, otherwise negative  BP 100/60   Pulse 70   Ht 5\' 6"  (1.676 m)   Wt 132 lb (59.9 kg)   BMI 21.31 kg/m  Gen: awake, alert, NAD HEENT: anicteric  Abd: soft, NT/ND, +BS throughout Ext: no c/c/e Neuro: nonfocal   RELEVANT LABS AND IMAGING: Labs reviewed by care everywhere, hemoglobin 14.0, MCV 93.9, white count 6.2 and platelet count 196 in July 2023 Normal liver enzymes Ferritin 68.5 Normal TSH  INDICATION:Nausea/vomiting \ exocrine pancreatic insufficiency, family history of pancreatitis, abdominal pain, nausea, rectal bleeding, poor appetite \ K51.20 Ulcerative proctitis without complication (HCC) \ R10.13 Epigastric pain  COMPARISON: None.   TECHNIQUE: Multislice axial images were obtained through the abdomen and pelvis with administration of iodinated intravenous contrast material. Multi-planar reformatted images were generated for additional analysis. Nongated technique limits cardiac detail.   All CT scans at [facility] and [facility] are performed using radiation dose optimization techniques as appropriate to a performed exam, including but not limited to one or more of the following: automatic exposure control, adjustment of the mA and/or kV according to patient size, use of iterative reconstruction technique. In addition, our institution participates in a radiation dose monitoring program to optimize patient radiation exposure.   FINDINGS:   LOWER CHEST:  .  Heart/vessel: Normal heart size. No pericardial effusion.  .  Lungs/Pleura: Within normal limits.   ABDOMEN/PELVIS:  . Liver: Scattered hypoattenuating hepatic lesions, many of which are fluid attenuating, and one demonstrating  peripheral nodular enhancement (series 2 image 19). Others are too small to characterize. These may reflect a combination of cysts and hemangiomas.  .  Gallbladder/biliary: Within normal limits.  .  Spleen: Within normal limits.  .  Pancreas: Within normal limits.  .  Adrenals: Within normal limits.  .  Kidneys/Ureters/Bladder:  - Symmetric renal enhancement with normal contour.  - No nephrolithiasis, hydronephrosis or hydroureter.  - Urinary bladder is within normal limits.  .  Peritoneum/Mesentery/Extraperitoneum: Within normal limits.  .  GI tract: Colonic diverticulosis without active inflammatory changes.  .  Reproductive system: Hysterectomy. No adnexal masses.  .  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   MSK:  . Bones: Within normal limits for age.  .  Soft tissues: Within normal limits.   CONCLUSION:  No acute findings in the abdomen or pelvis. Specifically, no findings to suggest pancreatitis.    ASSESSMENT/PLAN: 61 year old female with past medical history of ulcerative proctitis/proctosigmoiditis, GERD with esophagitis, previous treated H. Pylori, history of anxiety and depression who is seen in consult at the request of Dr. Hal Hope to establish care for her IBD.   Ulcerative proctitis/proctosigmoiditis --by colonoscopy recently she had moderately active proctitis and rectosigmoiditis given that Dr. Shawn Reed saw inflammation from the dentate line to 20 cm.  She has been taking Canasa for nearly 4 months without significant improvement in her bleeding.  It sounds like her urgency and tenesmus is somewhat better.  It may be that the Canasa is not treating the proximalmost extent of her inflammation but either way her IBD is not under adequate control.  We discussed this today at length.  We discussed objective marker with fecal calprotectin,  trying topical steroid for 6 to 8 weeks as well as oral mesalamine.  If not improving she may need Entyvio. -- Fecal calprotectin -- Discontinue Canasa for now -- Uceris foam as directed for 6 to 8 weeks -- Lialda 2.4 g daily -- I asked that she email me by MyChart or call the office in 1 month to let me know how symptoms are doing, if no better flexible sigmoidoscopy and consideration of Entyvio  2.  GERD with esophagitis --she had EGD confirmed esophagitis recently, June 2023.  She expresses concern about the risk of long-term PPI.  We discussed this together today.  She would be open to H2 blocker which I do recommend given that this is more than GERD symptoms alone, she does have esophagitis/active inflammation. -- Famotidine 20 mg twice daily until symptoms completely resolved; then could use twice daily as needed dosing  3. Median arcuate ligament syndrome --she was having upper abdominal pain symptoms which led to a CT scan earlier this year.  This showed narrowing of the celiac artery with some poststenotic dilatation raising the question of median arcuate ligament syndrome.  We discussed this today and given that the vast majority of her upper GI symptoms including pain have resolved (with the exception of heartburn see #2) I do not think this needs further evaluation at this time.  We can monitor this going forward.  4.  EPI --she does have some oily stools and a low previous fecal calprotectin.  She may benefit from enzyme replacement with Creon or Zenpep but this was previously somewhat cost prohibitive.  We will keep this in mind for follow-up but focus attention primarily on #1 and 2 at this time    Follow-up with me in 3 to 4 months, sooner if needed    UE:[id number], Marcos Eke, Md [address],  [place] [address]

## 2022-07-04 ENCOUNTER — Other Ambulatory Visit: Payer: BC Managed Care – PPO

## 2022-07-04 DIAGNOSIS — K523 Indeterminate colitis: Secondary | ICD-10-CM | POA: Diagnosis not present

## 2022-07-04 DIAGNOSIS — K5130 Ulcerative (chronic) rectosigmoiditis without complications: Secondary | ICD-10-CM | POA: Diagnosis not present

## 2022-07-04 DIAGNOSIS — K51911 Ulcerative colitis, unspecified with rectal bleeding: Secondary | ICD-10-CM | POA: Diagnosis not present

## 2022-07-10 LAB — CALPROTECTIN, FECAL: Calprotectin, Fecal: 126 ug/g — ABNORMAL HIGH (ref 0–120)

## 2022-07-22 ENCOUNTER — Telehealth: Payer: Self-pay | Admitting: Cardiovascular Disease

## 2022-07-22 NOTE — Telephone Encounter (Signed)
Patient is calling for her monitor results.  

## 2022-07-22 NOTE — Telephone Encounter (Signed)
Results report in chart, Will submit to provider for review.

## 2022-07-26 ENCOUNTER — Telehealth: Payer: Self-pay | Admitting: Internal Medicine

## 2022-07-26 NOTE — Telephone Encounter (Signed)
Patient called states she had a fecal test done and received a bill and does not agree with the charge. Requested call after 3:30 pm.

## 2022-07-26 NOTE — Telephone Encounter (Unsigned)
Returned call to pt and she stated she was still in a meeting and would call back.  Pt called and her stool test was denied by insurance. Called [facility] and added on several codes.

## 2022-07-28 ENCOUNTER — Encounter: Payer: Self-pay | Admitting: Internal Medicine

## 2022-07-28 DIAGNOSIS — L821 Other seborrheic keratosis: Secondary | ICD-10-CM | POA: Diagnosis not present

## 2022-07-28 DIAGNOSIS — L814 Other melanin hyperpigmentation: Secondary | ICD-10-CM | POA: Diagnosis not present

## 2022-07-28 DIAGNOSIS — L820 Inflamed seborrheic keratosis: Secondary | ICD-10-CM | POA: Diagnosis not present

## 2022-07-28 DIAGNOSIS — Z85828 Personal history of other malignant neoplasm of skin: Secondary | ICD-10-CM | POA: Diagnosis not present

## 2022-07-29 ENCOUNTER — Encounter (HOSPITAL_BASED_OUTPATIENT_CLINIC_OR_DEPARTMENT_OTHER): Payer: Self-pay | Admitting: *Deleted

## 2022-08-01 DIAGNOSIS — Z13220 Encounter for screening for lipoid disorders: Secondary | ICD-10-CM | POA: Diagnosis not present

## 2022-08-02 LAB — HEPATIC FUNCTION PANEL
ALT: 14 IU/L (ref 0–32)
AST: 24 IU/L (ref 0–40)
Albumin: 4.2 g/dL (ref 3.9–4.9)
Alkaline Phosphatase: 95 IU/L (ref 44–121)
Bilirubin Total: 0.6 mg/dL (ref 0.0–1.2)
Bilirubin, Direct: 0.15 mg/dL (ref 0.00–0.40)
Total Protein: 6.2 g/dL (ref 6.0–8.5)

## 2022-08-02 LAB — LIPID PANEL
Chol/HDL Ratio: 3.6 ratio (ref 0.0–4.4)
Cholesterol, Total: 204 mg/dL — ABNORMAL HIGH (ref 100–199)
HDL: 57 mg/dL (ref >39)
LDL Chol Calc (NIH): 136 mg/dL — ABNORMAL HIGH (ref 0–99)
Triglycerides: 61 mg/dL (ref 0–149)
VLDL Cholesterol Cal: 11 mg/dL (ref 5–40)

## 2022-08-05 ENCOUNTER — Telehealth (HOSPITAL_BASED_OUTPATIENT_CLINIC_OR_DEPARTMENT_OTHER): Payer: Self-pay

## 2022-08-05 DIAGNOSIS — Z79899 Other long term (current) drug therapy: Secondary | ICD-10-CM

## 2022-08-05 DIAGNOSIS — Z13220 Encounter for screening for lipoid disorders: Secondary | ICD-10-CM

## 2022-08-05 MED ORDER — ROSUVASTATIN CALCIUM 20 MG PO TABS: 20.0000 mg | ORAL_TABLET | Freq: Every day | ORAL | 3 refills | Status: DC

## 2022-08-05 NOTE — Telephone Encounter (Signed)
Rx removed from patient list and labs pulled from the mail.

## 2022-08-05 NOTE — Addendum Note (Signed)
Addended by: Marlene Lard on: 08/05/2022 01:17 PM   Modules accepted: Orders

## 2022-08-05 NOTE — Telephone Encounter (Addendum)
Seen by patient Valerie Reed on 08/04/2022  9:48 PM; follow up mychart message sent to patient. Labs ordered and mailed, prescriptions updated and sent to pharmacy on file.     ----- Message from Alver Sorrow, NP sent at 08/04/2022  9:33 PM EST ----- Normal liver enzymes. Total cholesterol and LDL (bad cholesterol) elevated. LDL goal of less than 70. Recommend start Rosuvastatin 20mg  daily with FLP/LFT in 2 months.

## 2022-08-09 ENCOUNTER — Telehealth: Payer: Self-pay | Admitting: *Deleted

## 2022-08-09 DIAGNOSIS — N952 Postmenopausal atrophic vaginitis: Secondary | ICD-10-CM

## 2022-08-09 MED ORDER — ESTRADIOL 10 MCG VA TABS: 1.0000 | ORAL_TABLET | VAGINAL | 1 refills | Status: DC

## 2022-08-09 NOTE — Telephone Encounter (Signed)
Patient called has been using vaginal (estrace) cream since 12/2021. It appears her insurance would not pay for the generic vagifem unless she tried the estrace vaginal cream.  Patient would like to go back on vagifem reports the cream burns and doesn't feel it helps as well as the vagifem. Please advise.

## 2022-08-09 NOTE — Telephone Encounter (Signed)
Patient informed Rx had been sent.

## 2022-08-09 NOTE — Telephone Encounter (Signed)
I've sent a script for the generic vagifem for her. Does she need any kind of prior authorization for it? Thanks.

## 2022-08-11 ENCOUNTER — Telehealth: Payer: Self-pay | Admitting: *Deleted

## 2022-08-11 DIAGNOSIS — N952 Postmenopausal atrophic vaginitis: Secondary | ICD-10-CM

## 2022-08-11 NOTE — Telephone Encounter (Signed)
PA done via cover my meds for generic vagifem 10 mcg tablet. Pending response from insurance.

## 2022-08-12 ENCOUNTER — Ambulatory Visit (HOSPITAL_BASED_OUTPATIENT_CLINIC_OR_DEPARTMENT_OTHER): Payer: BC Managed Care – PPO | Admitting: Family

## 2022-08-22 NOTE — Telephone Encounter (Signed)
PA denied for estradiol vaginal tablets. Patient will need to try and fail formulary drugs such as Estring, premarin cream, Intrarosa vaginal suppository.    Patient has tried/failed estradiol cream ( this was documented on PA) now that she has tried estradiol per [employer] she will need to try/fail two additional formulary drugs as listed above.  I discuss the alternatives with patient and she is willing to try the Intrarosa if you agree with Rx. Please advise   Rx can be sent to [facility].

## 2022-08-23 NOTE — Telephone Encounter (Signed)
Patient said [facility] will send a fax request for vagifem Rx.

## 2022-08-23 NOTE — Telephone Encounter (Signed)
Patient informed with below note, she prefers to use vagifem. She is going to check with [facility] to see if cheaper, she will call me back.

## 2022-08-23 NOTE — Telephone Encounter (Signed)
She can try the Intrarosa if desired. It is a daily suppository. It is a different hormone than estrogen, DHEA. It is felt to work by being converted to estrogen and testosterone locally.   The estring would be easier, she just puts a ring in every 3 months. At the end of the 3 months she removes the ring and places another one.   Either choice is fine. Please send in a 3 month supply of either the daily intrarosa or one estring. She is due for an annual exam in 1/24.

## 2022-08-24 NOTE — Telephone Encounter (Signed)
Spoke with pt, she said that the [facility] pharmacy is providing her a discounted self pay price for 6 month supply.   She was made aware that I wasn't sure if Dr. Oscar La would want to send a 6 month supply since her annual is due in 09/2022.  Pt reported that she wasn't exactly sure why they offered her the 59month supply ("maybe that is the only way they dispense it.") Pt reported that she would be happy to go ahead and schedule her annual now if necessary.   But pt also reported that she would be ok if Dr. Oscar La only wanted to send 28month supply and she will see how the cost is with that.   FYI. Rx was sent on 08/11/22 for #24 w 1 refill to alternate pharmacy.  Please advise.

## 2022-08-25 MED ORDER — ESTRADIOL 10 MCG VA TABS: 1.0000 | ORAL_TABLET | VAGINAL | 0 refills | Status: DC

## 2022-08-25 NOTE — Telephone Encounter (Signed)
It's fine to send the 6 month supply to the pharmacy she requested. Thanks

## 2022-08-25 NOTE — Telephone Encounter (Signed)
Rx printed, signed and faxed successfully to [facility] [place] pharmacy at [phone].   Pt notified and voiced understanding. Pt also scheduled annual exam for 10/18/22 @ 9.

## 2022-09-06 DIAGNOSIS — F4320 Adjustment disorder, unspecified: Secondary | ICD-10-CM | POA: Diagnosis not present

## 2022-09-15 ENCOUNTER — Ambulatory Visit (HOSPITAL_BASED_OUTPATIENT_CLINIC_OR_DEPARTMENT_OTHER): Payer: BC Managed Care – PPO | Admitting: Family

## 2022-09-28 ENCOUNTER — Other Ambulatory Visit: Payer: Self-pay

## 2022-09-28 ENCOUNTER — Encounter: Payer: Self-pay | Admitting: Internal Medicine

## 2022-09-28 DIAGNOSIS — K5130 Ulcerative (chronic) rectosigmoiditis without complications: Secondary | ICD-10-CM

## 2022-09-28 NOTE — Telephone Encounter (Signed)
Please let patient know that I got her message I am glad that she is feeling better Based on her report I would continue the Lialda 2.4 g daily for the long-term I would also repeat fecal calprotectin to see if inflammation is indeed better corresponding to her symptoms She can follow-up with me in March, sooner if symptoms regress Ask her to please keep me informed

## 2022-10-03 ENCOUNTER — Other Ambulatory Visit: Payer: BC Managed Care – PPO

## 2022-10-04 ENCOUNTER — Other Ambulatory Visit: Payer: BC Managed Care – PPO

## 2022-10-04 DIAGNOSIS — K51911 Ulcerative colitis, unspecified with rectal bleeding: Secondary | ICD-10-CM | POA: Diagnosis not present

## 2022-10-04 DIAGNOSIS — K5130 Ulcerative (chronic) rectosigmoiditis without complications: Secondary | ICD-10-CM

## 2022-10-04 DIAGNOSIS — K523 Indeterminate colitis: Secondary | ICD-10-CM | POA: Diagnosis not present

## 2022-10-06 DIAGNOSIS — F4320 Adjustment disorder, unspecified: Secondary | ICD-10-CM | POA: Diagnosis not present

## 2022-10-08 LAB — CALPROTECTIN, FECAL: Calprotectin, Fecal: 15 ug/g (ref 0–120)

## 2022-10-10 NOTE — Progress Notes (Signed)
62 y.o. [id number] Divorced White or Caucasian Not Hispanic or Latino female here for annual exam.  H/O hysterectomy. She is using vagifem, will call when she needs a refill.   H/O ulcerative colitis, she had a long flare last year, currently in remission.   She has urinary frequency, small amounts. This has been going on for a long time. Some urgency to void. No leakage. No caffeine. Up 3 x a night to void.   No LMP recorded. Patient has had a hysterectomy.          Sexually active: No.  The current method of family planning is status post hysterectomy.    Exercising: Yes.     Walking, yoga, biking  Smoker:  no  Health Maintenance: Pap:  h/o hysterectomy  History of abnormal Pap:  no MMG:  02/24/22 Bi-rads 1 neg  BMD:   years ago  Colonoscopy: 6/23, f/u in 5 years.  TDaP:  utd per patient  Gardasil: n/a   reports that she quit smoking about 16 years ago. Her smoking use included cigarettes. She has never used smokeless tobacco. She reports that she does not drink alcohol and does not use drugs. 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 Kids are grown, 7 grandchildren (8-17), local.   Past Medical History:  Diagnosis Date   Anxiety    Anxiety disorder    Bipolar disorder ([place])    Depression    Diverticulosis    Gastritis    GERD (gastroesophageal reflux disease)    Helicobacter pylori gastritis 12/2006   Hemorrhoids    Hepatic cyst    IBS (irritable bowel syndrome)    Pericarditis    S/P dilatation of esophageal stricture 2008   UC (ulcerative colitis) ([place])    Left sided     Past Surgical History:  Procedure Laterality Date   ABDOMINAL HYSTERECTOMY     COLONOSCOPY     DILATION AND CURETTAGE OF UTERUS     ESOPHAGOGASTRODUODENOSCOPY     HERNIA REPAIR     inguineal in the 70's   TUBAL LIGATION     Tummy Tuck      Current Outpatient Medications  Medication Sig Dispense Refill   Estradiol 10 MCG TABS vaginal tablet Place 1 tablet (10 mcg  total) vaginally 2 (two) times a week. 48 tablet 0   famotidine (PEPCID) 20 MG tablet Take 1 tablet (20 mg total) by mouth 2 (two) times daily. 60 tablet 5   mesalamine (LIALDA) 1.2 g EC tablet Take 2 tablets (2.4 g total) by mouth daily with breakfast. 60 tablet 5   No current facility-administered medications for this visit.    Family History  Problem Relation Age of Onset   Hypertension Mother    Diabetes Mother    Heart disease Mother    Irritable bowel syndrome Mother    Alzheimer's disease Mother    Melanoma Father    Heart attack Brother 14   Hypertension Maternal Grandmother    Heart attack Paternal Grandmother    Hypertension Paternal Grandmother    Breast cancer Paternal Aunt     Review of Systems  All other systems reviewed and are negative.   Exam:   BP 110/78   Pulse 66   Ht 5\' 6"  (1.676 m)   Wt 130 lb (59 kg)   SpO2 100%   BMI 20.98 kg/m   Weight change: @WEIGHTCHANGE @ Height:   Height: 5\' 6"  (167.6 cm)  Ht Readings from  Last 3 Encounters:  10/18/22 5\' 6"  (1.676 m)  07/01/22 5\' 6"  (1.676 m)  05/10/22 5\' 6"  (1.676 m)    General appearance: alert, cooperative and appears stated age Head: Normocephalic, without obvious abnormality, atraumatic Neck: no adenopathy, supple, symmetrical, trachea midline and thyroid normal to inspection and palpation Lungs: clear to auscultation bilaterally Cardiovascular: regular rate and rhythm Breasts: normal appearance, no masses or tenderness Abdomen: soft, non-tender; non distended,  no masses,  no organomegaly Extremities: extremities normal, atraumatic, no cyanosis or edema Skin: Skin color, texture, turgor normal. No rashes or lesions Lymph nodes: Cervical, supraclavicular, and axillary nodes normal. No abnormal inguinal nodes palpated Neurologic: Grossly normal   Pelvic: External genitalia:  no lesions              Urethra:  normal appearing urethra with no masses, tenderness or lesions              Bartholins  and Skenes: normal                 Vagina: normal appearing vagina with normal color and discharge, no lesions              Cervix: absent               Bimanual Exam:  Uterus:  uterus absent              Adnexa: no mass, fullness, tenderness               Rectovaginal: Confirms               Anus:  normal sphincter tone, no lesions  Gae Dry, CMA chaperoned for the exam.  1. Well woman exam Discussed breast self exam Discussed calcium and vit D intake Labs with primary Mammogram in 6/24  2. Vaginal atrophy She will call when she needs a refill on vaginal estrogen  3. Overactive bladder Given information on bladder training, discussed the option of medication - Urinalysis, Complete - Urine Culture  4. Hypoestrogenism - DG Bone Density; Future  5. ULCERATIVE COLITIS, LEFT SIDED - DG Bone Density; Future

## 2022-10-11 ENCOUNTER — Encounter: Payer: Self-pay | Admitting: Internal Medicine

## 2022-10-13 DIAGNOSIS — F4320 Adjustment disorder, unspecified: Secondary | ICD-10-CM | POA: Diagnosis not present

## 2022-10-18 ENCOUNTER — Ambulatory Visit: Payer: BC Managed Care – PPO | Admitting: Obstetrics and Gynecology

## 2022-10-18 ENCOUNTER — Encounter: Payer: Self-pay | Admitting: Obstetrics and Gynecology

## 2022-10-18 VITALS — BP 110/78 | HR 66 | Ht 66.0 in | Wt 130.0 lb

## 2022-10-18 DIAGNOSIS — E2839 Other primary ovarian failure: Secondary | ICD-10-CM

## 2022-10-18 DIAGNOSIS — K5150 Left sided colitis without complications: Secondary | ICD-10-CM | POA: Diagnosis not present

## 2022-10-18 DIAGNOSIS — Z01419 Encounter for gynecological examination (general) (routine) without abnormal findings: Secondary | ICD-10-CM

## 2022-10-18 DIAGNOSIS — N3281 Overactive bladder: Secondary | ICD-10-CM | POA: Diagnosis not present

## 2022-10-18 DIAGNOSIS — N390 Urinary tract infection, site not specified: Secondary | ICD-10-CM | POA: Diagnosis not present

## 2022-10-18 DIAGNOSIS — N952 Postmenopausal atrophic vaginitis: Secondary | ICD-10-CM | POA: Diagnosis not present

## 2022-10-18 NOTE — Addendum Note (Signed)
Addended by: Joaquin Music on: 10/18/2022 10:13 AM   Modules accepted: Orders

## 2022-10-18 NOTE — Patient Instructions (Addendum)
EXERCISE   We recommended that you start or continue a regular exercise program for good health. Physical activity is anything that gets your body moving, some is better than none. The CDC recommends 150 minutes per week of Moderate-Intensity Aerobic Activity and 2 or more days of Muscle Strengthening Activity.  Benefits of exercise are limitless: helps weight loss/weight maintenance, improves mood and energy, helps with depression and anxiety, improves sleep, tones and strengthens muscles, improves balance, improves bone density, protects from chronic conditions such as heart disease, high blood pressure and diabetes and so much more. To learn more visit: https://www.cdc.gov/physicalactivity/index.html  DIET: Good nutrition starts with a healthy diet of fruits, vegetables, whole grains, and lean protein sources. Drink plenty of water for hydration. Minimize empty calories, sodium, sweets. For more information about dietary recommendations visit: https://health.gov/our-work/nutrition-physical-activity/dietary-guidelines and https://www.myplate.gov/  ALCOHOL:  Women should limit their alcohol intake to no more than 7 drinks/beers/glasses of wine (combined, not each!) per week. Moderation of alcohol intake to this level decreases your risk of breast cancer and liver damage.  If you are concerned that you may have a problem, or your friends have told you they are concerned about your drinking, there are many resources to help. A well-known program that is free, effective, and available to all people all over the nation is Alcoholics Anonymous.  Check out this site to learn more: https://www.aa.org/   CALCIUM AND VITAMIN D:  Adequate intake of calcium and Vitamin D are recommended for bone health.  You should be getting between 1000-1200 mg of calcium and 800 units of Vitamin D daily between diet and supplements  PAP SMEARS:  Pap smears, to check for cervical cancer or precancers,  have traditionally been  done yearly, scientific advances have shown that most women can have pap smears less often.  However, every woman still should have a physical exam from her gynecologist every year. It will include a breast check, inspection of the vulva and vagina to check for abnormal growths or skin changes, a visual exam of the cervix, and then an exam to evaluate the size and shape of the uterus and ovaries. We will also provide age appropriate advice regarding health maintenance, like when you should have certain vaccines, screening for sexually transmitted diseases, bone density testing, colonoscopy, mammograms, etc.   MAMMOGRAMS:  All women over 40 years old should have a routine mammogram.   COLON CANCER SCREENING: Now recommend starting at age 45. At this time colonoscopy is not covered for routine screening until 50. There are take home tests that can be done between 45-49.   COLONOSCOPY:  Colonoscopy to screen for colon cancer is recommended for all women at age 50.  We know, you hate the idea of the prep.  We agree, BUT, having colon cancer and not knowing it is worse!!  Colon cancer so often starts as a polyp that can be seen and removed at colonscopy, which can quite literally save your life!  And if your first colonoscopy is normal and you have no family history of colon cancer, most women don't have to have it again for 10 years.  Once every ten years, you can do something that may end up saving your life, right?  We will be happy to help you get it scheduled when you are ready.  Be sure to check your insurance coverage so you understand how much it will cost.  It may be covered as a preventative service at no cost, but you should check   your particular policy.      Breast Self-Awareness Breast self-awareness means being familiar with how your breasts look and feel. It involves checking your breasts regularly and reporting any changes to your health care provider. Practicing breast self-awareness is  important. A change in your breasts can be a sign of a serious medical problem. Being familiar with how your breasts look and feel allows you to find any problems early, when treatment is more likely to be successful. All women should practice breast self-awareness, including women who have had breast implants. How to do a breast self-exam One way to learn what is normal for your breasts and whether your breasts are changing is to do a breast self-exam. To do a breast self-exam: Look for Changes  Remove all the clothing above your waist. Stand in front of a mirror in a room with good lighting. Put your hands on your hips. Push your hands firmly downward. Compare your breasts in the mirror. Look for differences between them (asymmetry), such as: Differences in shape. Differences in size. Puckers, dips, and bumps in one breast and not the other. Look at each breast for changes in your skin, such as: Redness. Scaly areas. Look for changes in your nipples, such as: Discharge. Bleeding. Dimpling. Redness. A change in position. Feel for Changes Carefully feel your breasts for lumps and changes. It is best to do this while lying on your back on the floor and again while sitting or standing in the shower or tub with soapy water on your skin. Feel each breast in the following way: Place the arm on the side of the breast you are examining above your head. Feel your breast with the other hand. Start in the nipple area and make  inch (2 cm) overlapping circles to feel your breast. Use the pads of your three middle fingers to do this. Apply light pressure, then medium pressure, then firm pressure. The light pressure will allow you to feel the tissue closest to the skin. The medium pressure will allow you to feel the tissue that is a little deeper. The firm pressure will allow you to feel the tissue close to the ribs. Continue the overlapping circles, moving downward over the breast until you feel your  ribs below your breast. Move one finger-width toward the center of the body. Continue to use the  inch (2 cm) overlapping circles to feel your breast as you move slowly up toward your collarbone. Continue the up and down exam using all three pressures until you reach your armpit.  Write Down What You Find  Write down what is normal for each breast and any changes that you find. Keep a written record with breast changes or normal findings for each breast. By writing this information down, you do not need to depend only on memory for size, tenderness, or location. Write down where you are in your menstrual cycle, if you are still menstruating. If you are having trouble noticing differences in your breasts, do not get discouraged. With time you will become more familiar with the variations in your breasts and more comfortable with the exam. How often should I examine my breasts? Examine your breasts every month. If you are breastfeeding, the best time to examine your breasts is after a feeding or after using a breast pump. If you menstruate, the best time to examine your breasts is 5-7 days after your period is over. During your period, your breasts are lumpier, and it may be more   difficult to notice changes. When should I see my health care provider? See your health care provider if you notice: A change in shape or size of your breasts or nipples. A change in the skin of your breast or nipples, such as a reddened or scaly area. Unusual discharge from your nipples. A lump or thick area that was not there before. Pain in your breasts. Anything that concerns you. Overactive Bladder, Adult  Overactive bladder is a condition in which a person has a sudden and frequent need to urinate. A person might also leak urine if he or she cannot get to the bathroom fast enough (urinary incontinence). Sometimes, symptoms can interfere with work or social activities. What are the causes? Overactive bladder is  associated with poor nerve signals between your bladder and your brain. Your bladder may get the signal to empty before it is full. You may also have very sensitive muscles that make your bladder squeeze too soon. This condition may also be caused by other factors, such as: Medical conditions: Urinary tract infection. Infection of nearby tissues. Prostate enlargement. Bladder stones, inflammation, or tumors. Diabetes. Muscle or nerve weakness, especially from these conditions: A spinal cord injury. Stroke. Multiple sclerosis. Parkinson's disease. Other causes: Surgery on the uterus or urethra. Drinking too much caffeine or alcohol. Certain medicines, especially those that eliminate extra fluid in the body (diuretics). Constipation. What increases the risk? You may be at greater risk for overactive bladder if you: Are an older adult. Smoke. Are going through menopause. Have prostate problems. Have a neurological disease, such as stroke, dementia, Parkinson's disease, or multiple sclerosis (MS). Eat or drink alcohol, spicy food, caffeine, and other things that irritate the bladder. Are overweight or obese. What are the signs or symptoms? Symptoms of this condition include a sudden, strong urge to urinate. Other symptoms include: Leaking urine. Urinating 8 or more times a day. Waking up to urinate 2 or more times overnight. How is this diagnosed? This condition may be diagnosed based on: Your symptoms and medical history. A physical exam. Blood or urine tests to check for possible causes, such as infection. You may also need to see a health care provider who specializes in urinary tract problems. This is called a urologist. How is this treated? Treatment for overactive bladder depends on the cause of your condition and whether it is mild or severe. Treatment may include: Bladder training, such as: Learning to control the urge to urinate by following a schedule to urinate at  regular intervals. Doing Kegel exercises to strengthen the pelvic floor muscles that support your bladder. Special devices, such as: Biofeedback. This uses sensors to help you become aware of your body's signals. Electrical stimulation. This uses electrodes placed inside the body (implanted) or outside the body. These electrodes send gentle pulses of electricity to strengthen the nerves or muscles that control the bladder. Women may use a plastic device, called a pessary, that fits into the vagina and supports the bladder. Medicines, such as: Antibiotics to treat bladder infection. Antispasmodics to stop the bladder from releasing urine at the wrong time. Tricyclic antidepressants to relax bladder muscles. Injections of botulinum toxin type A directly into the bladder tissue to relax bladder muscles. Surgery, such as: A device may be implanted to help manage the nerve signals that control urination. An electrode may be implanted to stimulate electrical signals in the bladder. A procedure may be done to change the shape of the bladder. This is done only in very severe   cases. Follow these instructions at home: Eating and drinking  Make diet or lifestyle changes recommended by your health care provider. These may include: Drinking fluids throughout the day and not only with meals. Cutting down on caffeine or alcohol. Eating a healthy and balanced diet to prevent constipation. This may include: Choosing foods that are high in fiber, such as beans, whole grains, and fresh fruits and vegetables. Limiting foods that are high in fat and processed sugars, such as fried and sweet foods. Lifestyle  Lose weight if needed. Do not use any products that contain nicotine or tobacco. These include cigarettes, chewing tobacco, and vaping devices, such as e-cigarettes. If you need help quitting, ask your health care provider. General instructions Take over-the-counter and prescription medicines only as  told by your health care provider. If you were prescribed an antibiotic medicine, take it as told by your health care provider. Do not stop taking the antibiotic even if you start to feel better. Use any implants or pessary as told by your health care provider. If needed, wear pads to absorb urine leakage. Keep a log to track how much and when you drink, and when you need to urinate. This will help your health care provider monitor your condition. Keep all follow-up visits. This is important. Contact a health care provider if: You have a fever or chills. Your symptoms do not get better with treatment. Your pain and discomfort get worse. You have more frequent urges to urinate. Get help right away if: You are not able to control your bladder. Summary Overactive bladder refers to a condition in which a person has a sudden and frequent need to urinate. Several conditions may lead to an overactive bladder. Treatment for overactive bladder depends on the cause and severity of your condition. Making lifestyle changes, doing Kegel exercises, keeping a log, and taking medicines can help with this condition. This information is not intended to replace advice given to you by your health care provider. Make sure you discuss any questions you have with your health care provider. Document Revised: 06/01/2020 Document Reviewed: 06/01/2020 Elsevier Patient Education  2023 Elsevier Inc.  

## 2022-10-19 LAB — URINE CULTURE
MICRO NUMBER:: 14460717
SPECIMEN QUALITY:: ADEQUATE

## 2022-10-20 ENCOUNTER — Other Ambulatory Visit: Payer: Self-pay

## 2022-10-20 DIAGNOSIS — N390 Urinary tract infection, site not specified: Secondary | ICD-10-CM

## 2022-10-20 MED ORDER — CEPHALEXIN 500 MG PO CAPS: 500.0000 mg | ORAL_CAPSULE | Freq: Two times a day (BID) | ORAL | 0 refills | Status: AC

## 2022-10-24 ENCOUNTER — Telehealth: Payer: Self-pay | Admitting: Internal Medicine

## 2022-10-24 NOTE — Telephone Encounter (Signed)
My apologies the code patient provided is [phone]. Please refer to previous message.  Thank you

## 2022-10-24 NOTE — Telephone Encounter (Signed)
Inbound call from patient giving a code for a stool test she took back in january. Patient states the code was given to her due to [facility] sending her a bill. Please advise.  Thank you

## 2022-10-25 NOTE — Telephone Encounter (Signed)
Spoke with [facility] and several dx codes were added on and claim has been resubmitted. State it may take 30-45 days to be processed.

## 2022-11-01 ENCOUNTER — Other Ambulatory Visit: Payer: BC Managed Care – PPO

## 2022-11-01 ENCOUNTER — Telehealth: Payer: Self-pay

## 2022-11-01 DIAGNOSIS — R3915 Urgency of urination: Secondary | ICD-10-CM

## 2022-11-01 LAB — URINALYSIS, COMPLETE
Bilirubin Urine: NEGATIVE
Casts: NONE SEEN /LPF
Crystals: NONE SEEN /HPF
Glucose, UA: NEGATIVE
Hgb urine dipstick: NEGATIVE
Hyaline Cast: NONE SEEN /LPF
Ketones, ur: NEGATIVE
Nitrite: NEGATIVE
Protein, ur: NEGATIVE
RBC / HPF: NONE SEEN /HPF (ref 0–2)
Specific Gravity, Urine: 1.005 (ref 1.001–1.035)
Yeast: NONE SEEN /HPF
pH: 5.5 (ref 5.0–8.0)

## 2022-11-01 NOTE — Telephone Encounter (Signed)
Pt notified and voiced understanding. Placed on lab schedule today @ 4pm. Will close this encounter.

## 2022-11-01 NOTE — Telephone Encounter (Signed)
Pt calling to report being treated for UTI a couple weeks ago. States she completed abx this past Thursday. However, did experience slight relief but not completely and now is experiencing frequency and urgency of urination and also consistent burning, even when not using restroom.  FYI. Pt only available to be seen after 3pm today due to watching grandchild and unable to come in tomorrow. Please advise.

## 2022-11-01 NOTE — Telephone Encounter (Signed)
Please have her come in this afternoon for a nurse visit, I will order a urine on her. Have them bring me the results. Thanks!

## 2022-11-02 LAB — URINE CULTURE
MICRO NUMBER:: 14525821
SPECIMEN QUALITY:: ADEQUATE

## 2022-11-03 ENCOUNTER — Telehealth: Payer: Self-pay

## 2022-11-03 DIAGNOSIS — N3281 Overactive bladder: Secondary | ICD-10-CM

## 2022-11-03 DIAGNOSIS — R3915 Urgency of urination: Secondary | ICD-10-CM

## 2022-11-03 NOTE — Telephone Encounter (Signed)
Pt notified and voiced understanding. Will send referral.

## 2022-11-03 NOTE — Telephone Encounter (Signed)
Please go ahead and place the referral to Urology.  Her ua is fine, nothing concerning. WBC and leukocytes are the same thing, normal to see some of those on a ua. Epithelial cells implies a contaminated specimen. Bacteria are on the skin and in the vagina. Not significant with a negative culture.

## 2022-11-03 NOTE — Telephone Encounter (Signed)
Referral faxed successfully.  

## 2022-11-03 NOTE — Telephone Encounter (Signed)
Pt notified of ucx results/recommendations.  Pt reports that it is difficult in staying hydrated due to the amt of times she has to used restroom throughout the day. Would like to go ahead a move forward with referral to urology. Pt advised we typically send our patient's to [facility] voiced understanding. In the meantime, pt just wanted to inquire about the flags from UA results seen on mychart for WBCs/Leuks, bacteria, squamous cells, being any cause for concern. Please advise.

## 2022-11-10 DIAGNOSIS — F4320 Adjustment disorder, unspecified: Secondary | ICD-10-CM | POA: Diagnosis not present

## 2022-11-17 NOTE — Telephone Encounter (Signed)
Valerie Reed w/ [facility] and they reported that they contact pt for an appt on 11/10/22 and she declined an appt. Stated she was feeling better and will contact them if sxs reoccur. Will route to provider for final review and close encounter/referral.

## 2023-01-04 DIAGNOSIS — H0102B Squamous blepharitis left eye, upper and lower eyelids: Secondary | ICD-10-CM | POA: Diagnosis not present

## 2023-01-04 DIAGNOSIS — H43391 Other vitreous opacities, right eye: Secondary | ICD-10-CM | POA: Diagnosis not present

## 2023-01-04 DIAGNOSIS — H0102A Squamous blepharitis right eye, upper and lower eyelids: Secondary | ICD-10-CM | POA: Diagnosis not present

## 2023-01-04 DIAGNOSIS — H43812 Vitreous degeneration, left eye: Secondary | ICD-10-CM | POA: Diagnosis not present

## 2023-01-05 ENCOUNTER — Other Ambulatory Visit: Payer: Self-pay | Admitting: Internal Medicine

## 2023-02-14 ENCOUNTER — Encounter: Payer: Self-pay | Admitting: Internal Medicine

## 2023-02-15 ENCOUNTER — Other Ambulatory Visit: Payer: Self-pay

## 2023-02-15 DIAGNOSIS — K5130 Ulcerative (chronic) rectosigmoiditis without complications: Secondary | ICD-10-CM

## 2023-02-15 MED ORDER — MESALAMINE 1.2 G PO TBEC: 2.4000 g | DELAYED_RELEASE_TABLET | Freq: Every day | ORAL | 3 refills | Status: DC

## 2023-02-15 MED ORDER — MESALAMINE 4 G RE ENEM: 4.0000 g | ENEMA | Freq: Every day | RECTAL | 1 refills | Status: DC

## 2023-02-15 NOTE — Telephone Encounter (Signed)
Let patient know that I would repeat fecal calprotectin I would restart Lialda but at 2.4 g daily I would consider adding Rowasa enemas 4 g at bedtime.  This should work better and treat more proximally than the Canasa did previously Would do the enemas for 2 to 4 weeks but keep the Lialda going longer than that If symptoms fail to come back under control we should consider escalation of therapy with Entyvio as we discussed before Would get her an office visit, nonurgent, as we will continue to work on symptoms by phone

## 2023-02-16 ENCOUNTER — Other Ambulatory Visit: Payer: BC Managed Care – PPO

## 2023-02-16 ENCOUNTER — Telehealth: Payer: Self-pay | Admitting: Internal Medicine

## 2023-02-16 DIAGNOSIS — K5130 Ulcerative (chronic) rectosigmoiditis without complications: Secondary | ICD-10-CM | POA: Diagnosis not present

## 2023-02-16 DIAGNOSIS — K523 Indeterminate colitis: Secondary | ICD-10-CM | POA: Diagnosis not present

## 2023-02-16 DIAGNOSIS — K51911 Ulcerative colitis, unspecified with rectal bleeding: Secondary | ICD-10-CM | POA: Diagnosis not present

## 2023-02-16 NOTE — Telephone Encounter (Signed)
The code for the lab that was entered was K51.50. Hopefully this will cover the lab.

## 2023-02-16 NOTE — Telephone Encounter (Signed)
Patient called requesting a call back regarding coding for labs that were ordered. States previously labs were not coded correctly and received a high bill in the mail. States the correct code for latest labs that were ordered is [phone]. Please advise, thank you.

## 2023-02-28 LAB — CALPROTECTIN, FECAL: Calprotectin, Fecal: 24 ug/g (ref 0–120)

## 2023-03-01 ENCOUNTER — Encounter: Payer: Self-pay | Admitting: Internal Medicine

## 2023-03-22 ENCOUNTER — Telehealth: Payer: Self-pay | Admitting: Internal Medicine

## 2023-03-22 NOTE — Telephone Encounter (Signed)
Spoke with pt and let her know the codes were added on for her.

## 2023-03-22 NOTE — Telephone Encounter (Signed)
Inbound call from patient stating the code K51.50 was denied and she still received a bill for Labs done in May. States she spoke with [facility] and they gave her the codes K51.911 and K52.3. Requesting a call back to discuss further. Please advise, thank you.

## 2023-05-24 ENCOUNTER — Ambulatory Visit: Payer: BC Managed Care – PPO | Admitting: Internal Medicine

## 2023-08-21 ENCOUNTER — Other Ambulatory Visit: Payer: Self-pay | Admitting: Internal Medicine

## 2023-08-22 ENCOUNTER — Other Ambulatory Visit: Payer: Self-pay | Admitting: Internal Medicine

## 2023-08-23 DIAGNOSIS — L821 Other seborrheic keratosis: Secondary | ICD-10-CM | POA: Diagnosis not present

## 2023-08-23 DIAGNOSIS — L570 Actinic keratosis: Secondary | ICD-10-CM | POA: Diagnosis not present

## 2023-08-23 DIAGNOSIS — L814 Other melanin hyperpigmentation: Secondary | ICD-10-CM | POA: Diagnosis not present

## 2023-08-23 DIAGNOSIS — Z85828 Personal history of other malignant neoplasm of skin: Secondary | ICD-10-CM | POA: Diagnosis not present

## 2023-08-29 ENCOUNTER — Encounter: Payer: Self-pay | Admitting: Internal Medicine

## 2023-08-29 ENCOUNTER — Ambulatory Visit: Payer: BC Managed Care – PPO | Admitting: Internal Medicine

## 2023-08-29 VITALS — BP 112/68 | Ht 66.0 in | Wt 131.6 lb

## 2023-08-29 DIAGNOSIS — K51319 Ulcerative (chronic) rectosigmoiditis with unspecified complications: Secondary | ICD-10-CM

## 2023-08-29 DIAGNOSIS — K2100 Gastro-esophageal reflux disease with esophagitis, without bleeding: Secondary | ICD-10-CM | POA: Diagnosis not present

## 2023-08-29 NOTE — Patient Instructions (Addendum)
If your blood pressure at your visit was 140/90 or greater, please contact your primary care physician to follow up on this. ______________________________________________________  If you are age 64 or older, your body mass index should be between 23-30. Your Body mass index is 21.24 kg/m. If this is out of the aforementioned range listed, please consider follow up with your Primary Care Provider.  If you are age 68 or younger, your body mass index should be between 19-25. Your Body mass index is 21.24 kg/m. If this is out of the aformentioned range listed, please consider follow up with your Primary Care Provider.  ________________________________________________________  The [facility] providers would like to encourage you to use [facility] to communicate with providers for non-urgent requests or questions.  Due to long hold times on the telephone, sending your provider a message by [facility] may be a faster and more efficient way to get a response.  Please allow 48 business hours for a response.  Please remember that this is for non-urgent requests.  _______________________________________________________  Due to recent changes in healthcare laws, you may see the results of your imaging and laboratory studies on MyChart before your provider has had a chance to review them.  We understand that in some cases there may be results that are confusing or concerning to you. Not all laboratory results come back in the same time frame and the provider may be waiting for multiple results in order to interpret others.  Please give [place] 48 hours in order for your provider to thoroughly review all the results before contacting the office for clarification of your results.   Please do research at [email or web address] and look under Patient Medication Guide for information regarding  Velsipity, Thompson Grayer, Humira and [employer].  Thank you for entrusting me with your care and for choosing [facility], Dr. Erick Blinks

## 2023-08-29 NOTE — Progress Notes (Unsigned)
   Subjective:    Patient ID: Valerie Reed, female    DOB: [birth year], 62 y.o.   MRN: [id number]  HPI Valerie Reed is a 62 year old female with past medical history of ulcerative proctitis/proctosigmoiditis, GERD with esophagitis, previous treated H. Pylori, possibel EPI (low elastase), history of anxiety and depression who is here for followup.  She is here alone today  The patient reports a history of intolerance to mesalamine and Lialda, experiencing severe side effects such as joint and muscle pain, and a sensation of chest pressure, particularly after flights. These symptoms were severe enough to make the patient feel as if she had been physically beaten. The patient stopped the medication and noticed a significant improvement in these symptoms.  The colitis improves with mesalamine but after using for several weeks the systemic side-effects start.  The patient also reports a history of ulcerative colitis flares characterized by loose stools, bleeding, and occasional abdominal pain. These flares were managed with Rowasa enemas, which the patient used nightly for about five weeks after a significant flare over the summer. However, the patient has not used the enemas for the past two weeks. Despite the absence of visible blood in the stool, the patient reports a general feeling of inflammation throughout the body.  The patient has a history of anxiety about the potential for colon cancer due to the bleeding associated with the flares. However, the patient's last procedure in June of the previous year showed inflammation from the rectum to 20 centimeters and diverticulosis, but no signs of cancer.  The patient has a history of difficulty tolerating medications and supplements, expressing concern about potential side effects of new therapies. The patient is currently managing GERD symptoms with Pepcid as needed. The patient is due to turn 63 in a few weeks and is still working. The patient is due to  start Medicare Part A from January 1st of the next year.   Review of Systems As per HPI, otherwise negative  Current Medications, Allergies, Past Medical History, Past Surgical History, Family History and Social History were reviewed in [employer] record.     Objective:   Physical Exam BP 112/68   Ht 5\' 6"  (1.676 m)   Wt 131 lb 9.6 oz (59.7 kg)   BMI 21.24 kg/m  Gen: awake, alert, NAD HEENT: anicteric  Ext: no c/c/e Neuro: nonfocal  LABS Fecal calprotectin: 126 (07/04/2022) Fecal calprotectin: 15 (10/04/2022) Fecal calprotectin: 24 (02/16/2023)   DIAGNOSTIC Colonoscopy: Inflammation from rectum to 20 cm, diverticulosis, otherwise normal (03/07/2022)      Assessment & Plan:  Ulcerative Proctitis/Proctosigmoiditis Flares characterized by loose stools and bleeding. Adverse reactions to mesalamine (Lialda) and Uceris foam including joint pain, muscle pain, and chest pressure. Fecal calprotectin levels have shown improvement with medication, but patient has stopped due to side effects. -Discontinue mesalamine and Uceris foam due to adverse reactions. -Consider alternative therapies including Entyvio, Humira, Skyrizi, and Velsipity. Patient to review information on [email or web address] and discuss preferences at next appointment or before via MyChart. -Order fecal calprotectin to assess current inflammation level.  GERD with past esophagitis Controlled with occasional use of Pepcid. -Continue Pepcid as needed.  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  40 minutes total spent today including patient facing time, coordination of care, reviewing medical history/procedures/pertinent radiology studies, and documentation of the encounter.

## 2023-08-30 ENCOUNTER — Other Ambulatory Visit: Payer: Self-pay

## 2023-08-30 DIAGNOSIS — K51811 Other ulcerative colitis with rectal bleeding: Secondary | ICD-10-CM

## 2023-08-30 DIAGNOSIS — K523 Indeterminate colitis: Secondary | ICD-10-CM

## 2023-08-30 NOTE — Telephone Encounter (Signed)
I am not sure I was just covering your box.  Sorry

## 2023-08-31 ENCOUNTER — Other Ambulatory Visit: Payer: BC Managed Care – PPO

## 2023-09-04 ENCOUNTER — Other Ambulatory Visit: Payer: BC Managed Care – PPO

## 2023-09-04 DIAGNOSIS — K51811 Other ulcerative colitis with rectal bleeding: Secondary | ICD-10-CM | POA: Diagnosis not present

## 2023-09-04 DIAGNOSIS — K523 Indeterminate colitis: Secondary | ICD-10-CM | POA: Diagnosis not present

## 2023-09-06 LAB — CALPROTECTIN, FECAL: Calprotectin, Fecal: 5 ug/g (ref 0–120)

## 2023-09-21 ENCOUNTER — Other Ambulatory Visit: Payer: Self-pay | Admitting: Internal Medicine

## 2023-12-29 ENCOUNTER — Other Ambulatory Visit: Payer: Self-pay

## 2023-12-29 DIAGNOSIS — N952 Postmenopausal atrophic vaginitis: Secondary | ICD-10-CM

## 2023-12-29 NOTE — Telephone Encounter (Signed)
 Pt LVM stating that she was getting an rx w/ Dr. Oscar La for a script that she would get filled in [place] and is requesting refill on it until can be seen for AEX w/ EB on 01/30/2024.  Med refill request: Estradiol Vaginal Tablets Last AEX: 10/18/2022-JJ Next AEX: 01/30/2024-EB Last MMG (if hormonal med): 02/23/2022-WNL Refill authorized: Rx pend.   If approved, the pt would like the printed rx to [facility] at [phone].

## 2024-01-01 MED ORDER — ESTRADIOL 10 MCG VA TABS: 1.0000 | ORAL_TABLET | VAGINAL | 0 refills | Status: AC

## 2024-01-01 NOTE — Telephone Encounter (Signed)
 Rx faxed to [facility] successfully @ number from previous documentation. Will notify the pt via mychart msg.   Encounter closed.

## 2024-01-17 NOTE — Telephone Encounter (Signed)
 Pt LVM in triage line confirming that rx is being processed and sent to her and she voiced appreciation for the f/u. Encounter closed.

## 2024-01-17 NOTE — Telephone Encounter (Signed)
 [id number]

## 2024-01-23 ENCOUNTER — Encounter: Payer: Self-pay | Admitting: Internal Medicine

## 2024-01-23 ENCOUNTER — Other Ambulatory Visit: Payer: Self-pay

## 2024-01-23 MED ORDER — FAMOTIDINE 20 MG PO TABS: 20.0000 mg | ORAL_TABLET | ORAL | 5 refills | Status: AC | PRN

## 2024-01-30 ENCOUNTER — Ambulatory Visit: Admitting: Obstetrics and Gynecology

## 2024-02-14 ENCOUNTER — Ambulatory Visit: Payer: Self-pay | Admitting: Obstetrics and Gynecology

## 2024-02-14 ENCOUNTER — Encounter: Payer: Self-pay | Admitting: Obstetrics and Gynecology

## 2024-02-14 ENCOUNTER — Ambulatory Visit (INDEPENDENT_AMBULATORY_CARE_PROVIDER_SITE_OTHER): Admitting: Obstetrics and Gynecology

## 2024-02-14 VITALS — BP 102/62 | HR 74 | Ht 65.0 in | Wt 130.0 lb

## 2024-02-14 DIAGNOSIS — Z1331 Encounter for screening for depression: Secondary | ICD-10-CM

## 2024-02-14 DIAGNOSIS — Z01419 Encounter for gynecological examination (general) (routine) without abnormal findings: Secondary | ICD-10-CM

## 2024-02-14 DIAGNOSIS — M8580 Other specified disorders of bone density and structure, unspecified site: Secondary | ICD-10-CM | POA: Diagnosis not present

## 2024-02-14 DIAGNOSIS — R350 Frequency of micturition: Secondary | ICD-10-CM | POA: Diagnosis not present

## 2024-02-14 DIAGNOSIS — E2839 Other primary ovarian failure: Secondary | ICD-10-CM

## 2024-02-14 DIAGNOSIS — E041 Nontoxic single thyroid nodule: Secondary | ICD-10-CM

## 2024-02-14 DIAGNOSIS — Z1231 Encounter for screening mammogram for malignant neoplasm of breast: Secondary | ICD-10-CM

## 2024-02-14 LAB — URINALYSIS, COMPLETE W/RFL CULTURE
Bacteria, UA: NONE SEEN /HPF
Bilirubin Urine: NEGATIVE
Glucose, UA: NEGATIVE
Hgb urine dipstick: NEGATIVE
Hyaline Cast: NONE SEEN /LPF
Ketones, ur: NEGATIVE
Leukocyte Esterase: NEGATIVE
Nitrites, Initial: NEGATIVE
Protein, ur: NEGATIVE
RBC / HPF: NONE SEEN /HPF (ref 0–2)
Specific Gravity, Urine: 1.01 (ref 1.001–1.035)
WBC, UA: NONE SEEN /HPF (ref 0–5)
pH: 7 (ref 5.0–8.0)

## 2024-02-14 LAB — NO CULTURE INDICATED

## 2024-02-14 NOTE — Progress Notes (Signed)
 63 y.o. y.o. female here for annual exam. No LMP recorded. Patient has had a hysterectomy.   Has 7 grandchildren and loves spending time with them.  Worked in Environmental manager area and sold fosamax.  [id number] Divorced White or Caucasian Not Hispanic or Latino female here for annual exam.  H/O hysterectomy with BSO. She is using vagifem , will call when she needs a refill to [facility].   H/O ulcerative colitis, she had a long flare last year, currently in remission.   She has urinary frequency, small amounts. This has been going on for a long time. Some urgency to void. No leakage. No caffeine. Up 3 x a night to void.   Sexually active: No.  The current method of family planning is status post hysterectomy.    Exercising: Yes.    Walking, yoga, biking Reports 2 bike falls recently with no fractures  Smoker:  no  Health Maintenance: Pap:  h/o hysterectomy with BSO in her 30's No HRT currently but did for a few years after History of abnormal Pap:  no MMG:  02/24/22 Bi-rads 1 neg  BMD:   2007 risks factors with UC with osteopenia did not repeat. Suspect osteoporosis and counseled on evenity/prolia if needed. Labs completed today for the medication. Has severe reflux and cannot take fosamax. Colonoscopy: 6/23, f/u in 5 years. For IBS/UC TDaP:  utd per patient  Gardasil: n/a Body mass index is 21.63 kg/m.     02/14/2024    1:48 PM  Depression screen PHQ 2/9  Decreased Interest 1  Down, Depressed, Hopeless 2  PHQ - 2 Score 3  Altered sleeping 2  Tired, decreased energy 2  Change in appetite 2  Feeling bad or failure about yourself  0  Trouble concentrating 2  Moving slowly or fidgety/restless 1  Suicidal thoughts 0  PHQ-9 Score 12  Difficult doing work/chores Somewhat difficult    Blood pressure 102/62, pulse 74, height 5\' 5"  (1.651 m), weight 130 lb (59 kg), SpO2 97%.  No results found for: "DIAGPAP", "HPVHIGH", "ADEQPAP"  GYN HISTORY: No results found for:  "DIAGPAP", "HPVHIGH", "ADEQPAP"  OB History  Gravida Para Term Preterm AB Living  3 3 3   3   SAB IAB Ectopic Multiple Live Births      3    # Outcome Date GA Lbr Len/2nd Weight Sex Type Anes PTL Lv  3 Term      Vag-Spont   LIV  2 Term      Vag-Spont   LIV  1 Term      Vag-Spont   LIV    Past Medical History:  Diagnosis Date   Anxiety    Anxiety disorder    Bipolar disorder (HCC)    Depression    Diverticulosis    Gastritis    GERD (gastroesophageal reflux disease)    Helicobacter pylori gastritis 12/2006   Hemorrhoids    Hepatic cyst    HSV infection    oral   IBS (irritable bowel syndrome)    Pericarditis    S/P dilatation of esophageal stricture 2008   UC (ulcerative colitis) (HCC)    Left sided     Past Surgical History:  Procedure Laterality Date   ABDOMINAL HYSTERECTOMY     COLONOSCOPY     DILATION AND CURETTAGE OF UTERUS     ESOPHAGOGASTRODUODENOSCOPY     HERNIA REPAIR     inguineal in the 70's   TUBAL LIGATION     Tummy Tuck  Current Outpatient Medications on File Prior to Visit  Medication Sig Dispense Refill   Estradiol  10 MCG TABS vaginal tablet Place 1 tablet (10 mcg total) vaginally 2 (two) times a week. 48 tablet 0   famotidine  (PEPCID ) 20 MG tablet Take 1 tablet (20 mg total) by mouth as needed. 30 tablet 5   valACYclovir (VALTREX) 1000 MG tablet Take by mouth as needed.     No current facility-administered medications on file prior to visit.    Social History   Socioeconomic History   Marital status: Divorced    Spouse name: Not on file   Number of children: 3   Years of education: Not on file   Highest education level: Not on file  Occupational History   Occupation: Medical Sales  Tobacco Use   Smoking status: Former    Current packs/day: 0.00    Types: Cigarettes    Quit date: 12/30/2005    Years since quitting: 18.1   Smokeless tobacco: Never  Vaping Use   Vaping status: Never Used  Substance and Sexual Activity   Alcohol  use: No   Drug use: No   Sexual activity: Not Currently    Partners: Male    Birth control/protection: Surgical, Abstinence  Other Topics Concern   Not on file  Social History Narrative   Not on file   Social Drivers of Health   Financial Resource Strain: Low Risk  (08/26/2021)   Overall Financial Resource Strain (CARDIA)    Difficulty of Paying Living Expenses: Not hard at all  Food Insecurity: No Food Insecurity (08/26/2021)   Hunger Vital Sign    Worried About Running Out of Food in the Last Year: Never true    Ran Out of Food in the Last Year: Never true  Transportation Needs: No Transportation Needs (08/26/2021)   PRAPARE - Administrator, Civil Service (Medical): No    Lack of Transportation (Non-Medical): No  Physical Activity: Sufficiently Active (08/26/2021)   Exercise Vital Sign    Days of Exercise per Week: 6 days    Minutes of Exercise per Session: 60 min  Stress: Not on file  Social Connections: Not on file  Intimate Partner Violence: Not on file    Family History  Problem Relation Age of Onset   Hypertension Mother    Diabetes Mother    Heart disease Mother    Irritable bowel syndrome Mother    Alzheimer's disease Mother    Melanoma Father    Heart attack Brother 89   Hypertension Maternal Grandmother    Heart attack Paternal Grandmother    Hypertension Paternal Grandmother    Breast cancer Paternal Aunt      Allergies  Allergen Reactions   Dairy Aid [Tilactase] Other (See Comments)      Patient's last menstrual period was No LMP recorded. Patient has had a hysterectomy..            Review of Systems Alls systems reviewed and are negative.     Physical Exam Constitutional:      Appearance: Normal appearance.  Genitourinary:     Vulva normal.     No lesions in the vagina.     Right Labia: No rash, lesions or skin changes.    Left Labia: No lesions, skin changes or rash.    Vaginal cuff intact.    No vaginal discharge or  tenderness.     No vaginal prolapse present.    Moderate vaginal atrophy present.  Right Adnexa: absent.    Left Adnexa: absent.    Cervix is absent.     Uterus is absent.  Breasts:    Right: Normal.     Left: Normal.  HENT:     Head: Normocephalic.  Neck:     Thyroid: No thyroid mass, thyromegaly or thyroid tenderness.  Cardiovascular:     Rate and Rhythm: Normal rate and regular rhythm.     Heart sounds: Normal heart sounds, S1 normal and S2 normal.  Pulmonary:     Effort: Pulmonary effort is normal.     Breath sounds: Normal breath sounds and air entry.  Abdominal:     General: There is no distension.     Palpations: Abdomen is soft. There is no mass.     Tenderness: There is no abdominal tenderness. There is no guarding or rebound.  Musculoskeletal:        General: Normal range of motion.     Cervical back: Full passive range of motion without pain, normal range of motion and neck supple. No tenderness.     Right lower leg: No edema.     Left lower leg: No edema.  Neurological:     Mental Status: She is alert.  Skin:    General: Skin is warm.  Psychiatric:        Mood and Affect: Mood normal.        Behavior: Behavior normal.        Thought Content: Thought content normal.  Vitals and nursing note reviewed. Exam conducted with a chaperone present.       A:         Well Woman GYN exam                             P:        Pap smear collected today Encouraged annual mammogram screening Colon cancer screening up-to-date DXA ordered today last one 20 years ago with osteopenia with no follow-up or treatment will likely have osteoporosis considering risks with menopause and IBS/UC. Counseled on evenity prolia and r/b/ai labs today were collected. Offered brochure.  She agreed to get her dxa done asap Labs and immunizations to do with PMD Discussed breast self exams Encouraged healthy lifestyle practices Encouraged Vit D and Calcium    No follow-ups on  file.  Reinaldo Caras

## 2024-02-15 ENCOUNTER — Ambulatory Visit (HOSPITAL_BASED_OUTPATIENT_CLINIC_OR_DEPARTMENT_OTHER)
Admission: RE | Admit: 2024-02-15 | Discharge: 2024-02-15 | Disposition: A | Discharge status: Home / Self Care | Source: Ambulatory Visit | Attending: Obstetrics and Gynecology | Admitting: Obstetrics and Gynecology

## 2024-02-15 DIAGNOSIS — E2839 Other primary ovarian failure: Secondary | ICD-10-CM | POA: Diagnosis present

## 2024-02-15 DIAGNOSIS — Z01419 Encounter for gynecological examination (general) (routine) without abnormal findings: Secondary | ICD-10-CM | POA: Diagnosis present

## 2024-02-15 DIAGNOSIS — M8580 Other specified disorders of bone density and structure, unspecified site: Secondary | ICD-10-CM | POA: Diagnosis present

## 2024-02-15 DIAGNOSIS — E041 Nontoxic single thyroid nodule: Secondary | ICD-10-CM | POA: Diagnosis present

## 2024-02-15 LAB — COMPREHENSIVE METABOLIC PANEL WITH GFR
AG Ratio: 1.8 (calc) (ref 1.0–2.5)
ALT: 14 U/L (ref 6–29)
AST: 23 U/L (ref 10–35)
Albumin: 4.1 g/dL (ref 3.6–5.1)
Alkaline phosphatase (APISO): 91 U/L (ref 37–153)
BUN: 21 mg/dL (ref 7–25)
CO2: 25 mmol/L (ref 20–32)
Calcium: 9.3 mg/dL (ref 8.6–10.4)
Chloride: 104 mmol/L (ref 98–110)
Creat: 0.84 mg/dL (ref 0.50–1.05)
Globulin: 2.3 g/dL (ref 1.9–3.7)
Glucose, Bld: 102 mg/dL — ABNORMAL HIGH (ref 65–99)
Potassium: 4.5 mmol/L (ref 3.5–5.3)
Sodium: 138 mmol/L (ref 135–146)
Total Bilirubin: 0.4 mg/dL (ref 0.2–1.2)
Total Protein: 6.4 g/dL (ref 6.1–8.1)
eGFR: 77 mL/min/{1.73_m2} (ref >=60)

## 2024-02-15 LAB — LIPID PANEL
Cholesterol: 196 mg/dL (ref <200)
HDL: 54 mg/dL (ref >=50)
LDL Cholesterol (Calc): 108 mg/dL — ABNORMAL HIGH
Non-HDL Cholesterol (Calc): 142 mg/dL — ABNORMAL HIGH (ref <130)
Total CHOL/HDL Ratio: 3.6 (calc) (ref <5.0)
Triglycerides: 226 mg/dL — ABNORMAL HIGH (ref <150)

## 2024-02-15 LAB — TSH: TSH: 1.74 m[IU]/L (ref 0.40–4.50)

## 2024-02-15 LAB — VITAMIN D 25 HYDROXY (VIT D DEFICIENCY, FRACTURES): Vit D, 25-Hydroxy: 32 ng/mL (ref 30–100)

## 2024-02-15 LAB — CBC
HCT: 43.6 % (ref 35.0–45.0)
Hemoglobin: 14.7 g/dL (ref 11.7–15.5)
MCH: 32.2 pg (ref 27.0–33.0)
MCHC: 33.7 g/dL (ref 32.0–36.0)
MCV: 95.6 fL (ref 80.0–100.0)
MPV: 12 fL (ref 7.5–12.5)
Platelets: 207 10*3/uL (ref 140–400)
RBC: 4.56 10*6/uL (ref 3.80–5.10)
RDW: 11.4 % (ref 11.0–15.0)
WBC: 6.2 10*3/uL (ref 3.8–10.8)

## 2024-02-15 LAB — HEMOGLOBIN A1C
Hgb A1c MFr Bld: 5.5 % (ref <5.7)
Mean Plasma Glucose: 111 mg/dL
eAG (mmol/L): 6.2 mmol/L

## 2024-02-20 ENCOUNTER — Other Ambulatory Visit: Payer: Self-pay | Admitting: *Deleted

## 2024-02-20 DIAGNOSIS — M810 Age-related osteoporosis without current pathological fracture: Secondary | ICD-10-CM

## 2024-02-20 MED ORDER — ROMOSOZUMAB-AQQG 105 MG/1.17ML ~~LOC~~ SOSY: 210.0000 mg | PREFILLED_SYRINGE | Freq: Once | SUBCUTANEOUS | Status: DC

## 2024-02-20 NOTE — Telephone Encounter (Signed)
 We refer to Dr. Sofia Dunn at [facility] for thyroid  biopsy.

## 2024-02-27 ENCOUNTER — Encounter (HOSPITAL_BASED_OUTPATIENT_CLINIC_OR_DEPARTMENT_OTHER): Payer: Self-pay | Admitting: Radiology

## 2024-02-27 ENCOUNTER — Ambulatory Visit (HOSPITAL_BASED_OUTPATIENT_CLINIC_OR_DEPARTMENT_OTHER)
Admission: RE | Admit: 2024-02-27 | Discharge: 2024-02-27 | Disposition: A | Discharge status: Home / Self Care | Payer: Self-pay | Source: Ambulatory Visit | Attending: Obstetrics and Gynecology | Admitting: Obstetrics and Gynecology

## 2024-02-27 DIAGNOSIS — Z1231 Encounter for screening mammogram for malignant neoplasm of breast: Secondary | ICD-10-CM | POA: Insufficient documentation

## 2024-02-27 DIAGNOSIS — Z01419 Encounter for gynecological examination (general) (routine) without abnormal findings: Secondary | ICD-10-CM

## 2024-04-01 ENCOUNTER — Encounter: Payer: Self-pay | Admitting: *Deleted

## 2024-04-01 ENCOUNTER — Telehealth: Payer: Self-pay | Admitting: *Deleted

## 2024-05-10 NOTE — Telephone Encounter (Signed)
 Letter received from [facility] following appeal letter stating, after reviewing the information above, we are standing by our earlier decision to uphold the denial of coverage for procedure code: J3111 (Evenity ).   Routing to provider to review and advise.

## 2024-05-13 NOTE — Telephone Encounter (Signed)
 Insurance information submitted to [employer] portal. Will await summary of benefits for prolia .

## 2024-05-16 ENCOUNTER — Other Ambulatory Visit: Payer: Self-pay | Admitting: *Deleted

## 2024-05-16 DIAGNOSIS — M810 Age-related osteoporosis without current pathological fracture: Secondary | ICD-10-CM

## 2024-05-16 MED ORDER — DENOSUMAB 60 MG/ML ~~LOC~~ SOSY: 60.0000 mg | PREFILLED_SYRINGE | Freq: Once | SUBCUTANEOUS | Status: DC

## 2024-07-16 ENCOUNTER — Encounter (HOSPITAL_COMMUNITY): Payer: Self-pay

## 2024-07-16 ENCOUNTER — Other Ambulatory Visit: Payer: Self-pay

## 2024-07-16 ENCOUNTER — Telehealth: Payer: Self-pay

## 2024-07-16 ENCOUNTER — Telehealth: Payer: Self-pay | Admitting: Obstetrics and Gynecology

## 2024-07-16 ENCOUNTER — Other Ambulatory Visit: Payer: Self-pay | Admitting: Obstetrics and Gynecology

## 2024-07-16 ENCOUNTER — Other Ambulatory Visit: Payer: Self-pay | Admitting: Pharmacy Technician

## 2024-07-16 MED ORDER — JUBBONTI 60 MG/ML ~~LOC~~ SOSY
60.0000 mg | PREFILLED_SYRINGE | SUBCUTANEOUS | 6 refills | Status: DC
  Filled 2024-07-16 (×2): qty 1, 180d supply, fill #0

## 2024-07-16 MED ORDER — JUBBONTI 60 MG/ML ~~LOC~~ SOSY: 60.0000 mg | PREFILLED_SYRINGE | SUBCUTANEOUS | 6 refills | Status: DC

## 2024-07-16 NOTE — Telephone Encounter (Signed)
 [facility] received a new prescription for [facility]. We are not contracted with patient's insurance. She must fill this medication through [facility].   [facility] Thanks!

## 2024-07-16 NOTE — Progress Notes (Signed)
 Pharmacy Patient Advocate Encounter  Insurance verification completed.   The patient is insured through [facility]   Ran test claim for Jubbonti. Product not covered at our location.    This test claim was processed through [facility]- copay amounts may vary at other pharmacies due to pharmacy/plan contracts, or as the patient moves through the different stages of their insurance plan.

## 2024-07-16 NOTE — Progress Notes (Signed)
 Informed office via telephone encounter that patient must fill this medication through [facility]. Dis-enrolling.

## 2024-07-16 NOTE — Telephone Encounter (Signed)
 To try through [place] for jubbanti and if denied reclast. Dr. Glennon

## 2024-07-17 ENCOUNTER — Other Ambulatory Visit: Payer: Self-pay | Admitting: Obstetrics and Gynecology

## 2024-07-17 MED ORDER — JUBBONTI 60 MG/ML ~~LOC~~ SOSY
60.0000 mg | PREFILLED_SYRINGE | SUBCUTANEOUS | 6 refills | Status: AC
Start: 2024-07-17 — End: 2027-01-04

## 2024-07-24 NOTE — Telephone Encounter (Signed)
 PA request faxed to [facility] at [phone].

## 2024-08-15 ENCOUNTER — Other Ambulatory Visit: Payer: Self-pay

## 2024-10-29 ENCOUNTER — Telehealth: Payer: Self-pay | Admitting: Internal Medicine

## 2024-10-29 MED ORDER — MESALAMINE 4 G RE ENEM: 4.0000 g | ENEMA | Freq: Every day | RECTAL | 0 refills | Status: AC

## 2024-10-29 NOTE — Telephone Encounter (Signed)
 The following patient is calling to have a new prescription for mesalamine  rectal suspension 4mg  sent to [facility] in [place]. She stated that she had a flare and took some over the weekend  but she only has 1 left and is working well for her. Requesting a callback from a nurse.

## 2024-10-29 NOTE — Telephone Encounter (Signed)
 Patient reports this past week she has had a flare up of her ulcerative colitis with rectal bleeding. Patient reports a increase in stress at work. Patient states she had a old prescription of mesalamine  enemas that she started this past Saturday and it has helped the bleeding tremendously. Patient states she does not have any more enemas and would like a refill. Informed patient that I will send a refill to her pharmacy but she needs a follow up appt with Dr. Albertus to ensure her symptoms are improving. Scheduled appt for 11/13/24 at 9:10 am. Prescription sent to patient's pharmacy.

## 2024-11-13 ENCOUNTER — Ambulatory Visit: Admitting: Internal Medicine
